# Patient Record
Sex: Male | Born: 1960
Health system: Southern US, Community
[De-identification: ages and names within clinical notes are randomized; demographics above are authoritative.]

## PROBLEM LIST (undated history)

## (undated) DIAGNOSIS — I10 Essential (primary) hypertension: Secondary | ICD-10-CM

## (undated) HISTORY — DX: Essential (primary) hypertension: I10

---

## 2016-09-02 ENCOUNTER — Emergency Department (HOSPITAL_COMMUNITY)
Admission: EM | Admit: 2016-09-02 | Discharge: 2016-09-02 | Disposition: A | Discharge status: Home / Self Care | Payer: BLUE CROSS/BLUE SHIELD | Attending: Emergency Medicine | Admitting: Emergency Medicine

## 2016-09-02 ENCOUNTER — Encounter (HOSPITAL_COMMUNITY): Payer: Self-pay | Admitting: Emergency Medicine

## 2016-09-02 DIAGNOSIS — S40862A Insect bite (nonvenomous) of left upper arm, initial encounter: Secondary | ICD-10-CM | POA: Diagnosis not present

## 2016-09-02 DIAGNOSIS — Y929 Unspecified place or not applicable: Secondary | ICD-10-CM | POA: Diagnosis not present

## 2016-09-02 DIAGNOSIS — R509 Fever, unspecified: Secondary | ICD-10-CM | POA: Diagnosis not present

## 2016-09-02 DIAGNOSIS — J111 Influenza due to unidentified influenza virus with other respiratory manifestations: Secondary | ICD-10-CM | POA: Diagnosis not present

## 2016-09-02 DIAGNOSIS — M791 Myalgia: Secondary | ICD-10-CM | POA: Diagnosis not present

## 2016-09-02 DIAGNOSIS — Y990 Civilian activity done for income or pay: Secondary | ICD-10-CM | POA: Diagnosis not present

## 2016-09-02 DIAGNOSIS — S80861A Insect bite (nonvenomous), right lower leg, initial encounter: Secondary | ICD-10-CM | POA: Insufficient documentation

## 2016-09-02 DIAGNOSIS — S80862A Insect bite (nonvenomous), left lower leg, initial encounter: Secondary | ICD-10-CM | POA: Diagnosis not present

## 2016-09-02 DIAGNOSIS — R21 Rash and other nonspecific skin eruption: Secondary | ICD-10-CM | POA: Insufficient documentation

## 2016-09-02 DIAGNOSIS — R233 Spontaneous ecchymoses: Secondary | ICD-10-CM | POA: Diagnosis not present

## 2016-09-02 DIAGNOSIS — W57XXXA Bitten or stung by nonvenomous insect and other nonvenomous arthropods, initial encounter: Secondary | ICD-10-CM | POA: Insufficient documentation

## 2016-09-02 DIAGNOSIS — R6889 Other general symptoms and signs: Secondary | ICD-10-CM

## 2016-09-02 DIAGNOSIS — Y9389 Activity, other specified: Secondary | ICD-10-CM | POA: Diagnosis not present

## 2016-09-02 DIAGNOSIS — S40861A Insect bite (nonvenomous) of right upper arm, initial encounter: Secondary | ICD-10-CM | POA: Insufficient documentation

## 2016-09-02 DIAGNOSIS — I499 Cardiac arrhythmia, unspecified: Secondary | ICD-10-CM | POA: Diagnosis not present

## 2016-09-02 LAB — CBC WITH DIFFERENTIAL/PLATELET
BASOS PCT: 0 %
Basophils Absolute: 0 10*3/uL (ref 0.0–0.1)
EOS ABS: 0.1 10*3/uL (ref 0.0–0.7)
Eosinophils Relative: 1 %
HEMATOCRIT: 42.3 % (ref 39.0–52.0)
HEMOGLOBIN: 14.3 g/dL (ref 13.0–17.0)
LYMPHS ABS: 1 10*3/uL (ref 0.7–4.0)
Lymphocytes Relative: 12 %
MCH: 31.9 pg (ref 26.0–34.0)
MCHC: 33.8 g/dL (ref 30.0–36.0)
MCV: 94.4 fL (ref 78.0–100.0)
Monocytes Absolute: 0.6 10*3/uL (ref 0.1–1.0)
Monocytes Relative: 8 %
NEUTROS ABS: 6.7 10*3/uL (ref 1.7–7.7)
NEUTROS PCT: 79 %
Platelets: 179 10*3/uL (ref 150–400)
RBC: 4.48 MIL/uL (ref 4.22–5.81)
RDW: 12.2 % (ref 11.5–15.5)
WBC: 8.5 10*3/uL (ref 4.0–10.5)

## 2016-09-02 LAB — COMPREHENSIVE METABOLIC PANEL
ALBUMIN: 3.4 g/dL — AB (ref 3.5–5.0)
ALK PHOS: 60 U/L (ref 38–126)
ALT: 32 U/L (ref 17–63)
AST: 26 U/L (ref 15–41)
Anion gap: 8 (ref 5–15)
BILIRUBIN TOTAL: 0.9 mg/dL (ref 0.3–1.2)
BUN: 20 mg/dL (ref 6–20)
CALCIUM: 8.9 mg/dL (ref 8.9–10.3)
CO2: 25 mmol/L (ref 22–32)
CREATININE: 1.22 mg/dL (ref 0.61–1.24)
Chloride: 107 mmol/L (ref 101–111)
GFR calc Af Amer: >60 mL/min (ref >60)
GFR calc non Af Amer: >60 mL/min (ref >60)
GLUCOSE: 150 mg/dL — AB (ref 65–99)
Potassium: 3.9 mmol/L (ref 3.5–5.1)
SODIUM: 140 mmol/L (ref 135–145)
Total Protein: 7 g/dL (ref 6.5–8.1)

## 2016-09-02 MED ORDER — DOXYCYCLINE HYCLATE 50 MG PO CAPS: 100.0000 mg | ORAL_CAPSULE | Freq: Two times a day (BID) | ORAL | 0 refills | Status: AC

## 2016-09-02 NOTE — ED Provider Notes (Signed)
[facility]-EMER[facility]ENCY DEPT Provider Note   CSN: [id number] Arrival date & time: 09/02/16  1424     History   Chief Complaint Chief Complaint  Patient presents with  . Rash  . Generalized Body Aches    HPI Brett Cervantes is a 56 y.o. male presenting with 10 day history of fever, malaise, and rash.  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 About 10 days ago, he started to notice that he felt very tired will he was doing his normal amount of work. He started to complain of body aches, and was taking ibuprofen to help with this. He felt like he was having intermittent fevers with T max of 101, and was trying to keep this controlled with ibuprofen. Early last week, he noticed a rash on his lower legs. Since then he has noticed that the rash has spread to his upper extremities and torso. The rash is nonpruritic and nonpainful. He denies headaches, neck stiffness, chest pain, shortness of breath, nausea, vomiting, or abdominal pain. Patient states that today he feels slightly more energetic. Patient went to his primary care provider today, and was assessed for his symptoms. At that time, his heart rate was 170. They did an EKG, and an indolence was called. Patient refused ambulance services, and drove here by himself.  HPI  History reviewed. No pertinent past medical history.  There are no active problems to display for this patient.   History reviewed. No pertinent surgical history.     Home Medications    Prior to Admission medications   Medication Sig Start Date End Date Taking? Authorizing Provider  doxycycline (VIBRAMYCIN) 50 MG capsule Take 2 capsules (100 mg total) by mouth 2 (two) times daily. 09/02/16 09/09/16  , , PA-C    Family History No family history on file.  Social History Social History  Substance Use Topics  . Smoking status: Never Smoker  . Smokeless tobacco: Never Used  . Alcohol use 1.2 oz/week      2 Cans of beer per week     Allergies   Patient has no allergy information on record.   Review of Systems Review of Systems  All other systems reviewed and are negative.    Physical Exam Updated Vital Signs BP (!) 144/91 (BP Location: Right Arm)   Pulse 87   Temp 99 F (37.2 C) (Oral)   Resp 20   Ht 5\' 11"  (1.803 m)   Wt 80.7 kg (178 lb)   SpO2 98%   BMI 24.83 kg/m   Physical Exam  Constitutional: He is oriented to person, place, and time. He appears well-developed and well-nourished. No distress.  HENT:  Head: Normocephalic and atraumatic.  Nose: Nose normal.  Mouth/Throat: Uvula is midline and oropharynx is clear and moist.  Eyes: Conjunctivae and EOM are normal. Pupils are equal, round, and reactive to light.  Neck: Normal range of motion. Neck supple. No Brudzinski's sign noted.  Cardiovascular: Normal rate, regular rhythm, normal heart sounds and intact distal pulses.   Pulmonary/Chest: Effort normal and breath sounds normal. No respiratory distress. He has no wheezes.  Abdominal: Soft. Bowel sounds are normal. He exhibits no distension. There is no tenderness.  Musculoskeletal: Normal range of motion.  Neurological: He is alert and oriented to person, place, and time.  Skin: Skin is warm and dry. Rash noted. He is not diaphoretic.  Patient with nonblanching petechial rash of the upper and lower  extremities. Patient with blanching rash of the torso.  Psychiatric: He has a normal mood and affect.     ED Treatments / Results  Labs (all labs ordered are listed, but only abnormal results are displayed) Labs Reviewed  COMPREHENSIVE METABOLIC PANEL - Abnormal; Notable for the following:       Result Value   Glucose, Bld 150 (*)    Albumin 3.4 (*)    All other components within normal limits  CBC WITH DIFFERENTIAL/PLATELET  ROCKY MTN SPOTTED FVR ABS PNL(IGG+IGM)    EKG  EKG Interpretation None       Radiology No results  found.  Procedures Procedures (including critical care time)  Medications Ordered in ED Medications - No data to display   Initial Impression / Assessment and Plan / ED Course  I have reviewed the triage vital signs and the nursing notes.  Pertinent labs & imaging results that were available during my care of the patient were reviewed by me and considered in my medical decision making (see chart for details).     Patient with known exposure to ticks 2 weeks ago developing flulike symptoms and rash. Concern for [facility] spotted fever. Additionally, patient had elevated heart rate at primary care, but EKG here was reassuring. Labs are reassuring, as white count is normal, platelets are normal, and H&H normal. Will order titer for RMSF. Will treat empirically with doxycycline. Discussed case with attending, Dr. Freida Busman agrees to plan. Discussed findings with patient. Discussed use of doxycycline and risks being outside, inclduing increased sensitivity to light. Return precautions given. Patient states he understands and agreed to plan  Final Clinical Impressions(s) / ED Diagnoses   Final diagnoses:  Flu-like symptoms  Rash    New Prescriptions Discharge Medication List as of 09/02/2016  7:46 PM    START taking these medications   Details  doxycycline (VIBRAMYCIN) 50 MG capsule Take 2 capsules (100 mg total) by mouth 2 (two) times daily., Starting Mon 09/02/2016, Until Mon 09/09/2016, Print         , [place], PA-C 09/02/16 2051    Lorre Nick, MD 09/04/16 2332

## 2016-09-02 NOTE — ED Triage Notes (Signed)
Pt st's he pulled 2 ticks off of him 2 weeks ago.  Last week started having generalized body aches with elevated temp

## 2016-09-02 NOTE — Discharge Instructions (Signed)
Take the doxycycline as prescribed. You may continue to use ibuprofen or Tylenol as needed for fever and pain control. Continue to stay well-hydrated. Follow-up with your primary care in 1 week to ensure resolution of symptoms. Return to the emergency department if you have worsening fever, chills, or myalgias. Return to the ER if you develop any new or worsening symptoms.

## 2016-09-06 LAB — ROCKY MTN SPOTTED FVR ABS PNL(IGG+IGM)
RMSF IGG: NEGATIVE
RMSF IGM: 0.16 {index} (ref 0.00–0.89)

## 2017-03-27 DIAGNOSIS — N50812 Left testicular pain: Secondary | ICD-10-CM | POA: Diagnosis not present

## 2017-04-01 ENCOUNTER — Other Ambulatory Visit: Payer: Self-pay | Admitting: Family Medicine

## 2017-04-01 ENCOUNTER — Ambulatory Visit
Admission: RE | Admit: 2017-04-01 | Discharge: 2017-04-01 | Disposition: A | Discharge status: Home / Self Care | Payer: BLUE CROSS/BLUE SHIELD | Source: Ambulatory Visit | Attending: Family Medicine | Admitting: Family Medicine

## 2017-04-01 DIAGNOSIS — N509 Disorder of male genital organs, unspecified: Secondary | ICD-10-CM | POA: Diagnosis not present

## 2017-04-01 DIAGNOSIS — N50812 Left testicular pain: Secondary | ICD-10-CM

## 2017-07-07 DIAGNOSIS — R102 Pelvic and perineal pain: Secondary | ICD-10-CM | POA: Diagnosis not present

## 2017-08-04 DIAGNOSIS — K4090 Unilateral inguinal hernia, without obstruction or gangrene, not specified as recurrent: Secondary | ICD-10-CM | POA: Diagnosis not present

## 2017-09-26 DIAGNOSIS — R102 Pelvic and perineal pain: Secondary | ICD-10-CM | POA: Diagnosis not present

## 2017-09-26 DIAGNOSIS — Z125 Encounter for screening for malignant neoplasm of prostate: Secondary | ICD-10-CM | POA: Diagnosis not present

## 2017-11-13 DIAGNOSIS — L814 Other melanin hyperpigmentation: Secondary | ICD-10-CM | POA: Diagnosis not present

## 2017-11-13 DIAGNOSIS — L578 Other skin changes due to chronic exposure to nonionizing radiation: Secondary | ICD-10-CM | POA: Diagnosis not present

## 2017-11-13 DIAGNOSIS — D229 Melanocytic nevi, unspecified: Secondary | ICD-10-CM | POA: Diagnosis not present

## 2017-11-13 DIAGNOSIS — L821 Other seborrheic keratosis: Secondary | ICD-10-CM | POA: Diagnosis not present

## 2017-11-13 DIAGNOSIS — L570 Actinic keratosis: Secondary | ICD-10-CM | POA: Diagnosis not present

## 2018-01-21 DIAGNOSIS — Z23 Encounter for immunization: Secondary | ICD-10-CM | POA: Diagnosis not present

## 2018-01-21 DIAGNOSIS — M25562 Pain in left knee: Secondary | ICD-10-CM | POA: Diagnosis not present

## 2018-01-23 ENCOUNTER — Other Ambulatory Visit: Payer: Self-pay | Admitting: Family Medicine

## 2018-01-23 DIAGNOSIS — M25562 Pain in left knee: Secondary | ICD-10-CM

## 2018-04-14 DIAGNOSIS — M722 Plantar fascial fibromatosis: Secondary | ICD-10-CM | POA: Diagnosis not present

## 2018-05-03 DIAGNOSIS — J209 Acute bronchitis, unspecified: Secondary | ICD-10-CM | POA: Diagnosis not present

## 2018-12-23 DIAGNOSIS — D1801 Hemangioma of skin and subcutaneous tissue: Secondary | ICD-10-CM | POA: Diagnosis not present

## 2018-12-23 DIAGNOSIS — L820 Inflamed seborrheic keratosis: Secondary | ICD-10-CM | POA: Diagnosis not present

## 2018-12-23 DIAGNOSIS — D225 Melanocytic nevi of trunk: Secondary | ICD-10-CM | POA: Diagnosis not present

## 2018-12-23 DIAGNOSIS — L570 Actinic keratosis: Secondary | ICD-10-CM | POA: Diagnosis not present

## 2018-12-23 DIAGNOSIS — L814 Other melanin hyperpigmentation: Secondary | ICD-10-CM | POA: Diagnosis not present

## 2018-12-23 DIAGNOSIS — L821 Other seborrheic keratosis: Secondary | ICD-10-CM | POA: Diagnosis not present

## 2019-02-02 DIAGNOSIS — R1032 Left lower quadrant pain: Secondary | ICD-10-CM | POA: Diagnosis not present

## 2019-02-02 DIAGNOSIS — I499 Cardiac arrhythmia, unspecified: Secondary | ICD-10-CM | POA: Diagnosis not present

## 2019-02-02 DIAGNOSIS — Z23 Encounter for immunization: Secondary | ICD-10-CM | POA: Diagnosis not present

## 2019-02-02 DIAGNOSIS — Z125 Encounter for screening for malignant neoplasm of prostate: Secondary | ICD-10-CM | POA: Diagnosis not present

## 2019-02-02 DIAGNOSIS — Z0000 Encounter for general adult medical examination without abnormal findings: Secondary | ICD-10-CM | POA: Diagnosis not present

## 2019-02-02 DIAGNOSIS — Z1159 Encounter for screening for other viral diseases: Secondary | ICD-10-CM | POA: Diagnosis not present

## 2019-02-03 DIAGNOSIS — R634 Abnormal weight loss: Secondary | ICD-10-CM | POA: Diagnosis not present

## 2019-02-03 DIAGNOSIS — R1032 Left lower quadrant pain: Secondary | ICD-10-CM | POA: Diagnosis not present

## 2019-02-03 DIAGNOSIS — Z1211 Encounter for screening for malignant neoplasm of colon: Secondary | ICD-10-CM | POA: Diagnosis not present

## 2019-05-11 DIAGNOSIS — Z5181 Encounter for therapeutic drug level monitoring: Secondary | ICD-10-CM | POA: Diagnosis not present

## 2019-05-11 DIAGNOSIS — I499 Cardiac arrhythmia, unspecified: Secondary | ICD-10-CM | POA: Diagnosis not present

## 2019-05-11 DIAGNOSIS — M1990 Unspecified osteoarthritis, unspecified site: Secondary | ICD-10-CM | POA: Diagnosis not present

## 2019-05-11 DIAGNOSIS — Z13220 Encounter for screening for lipoid disorders: Secondary | ICD-10-CM | POA: Diagnosis not present

## 2019-05-11 DIAGNOSIS — I10 Essential (primary) hypertension: Secondary | ICD-10-CM | POA: Diagnosis not present

## 2019-07-19 DIAGNOSIS — R198 Other specified symptoms and signs involving the digestive system and abdomen: Secondary | ICD-10-CM | POA: Diagnosis not present

## 2019-07-19 DIAGNOSIS — R1032 Left lower quadrant pain: Secondary | ICD-10-CM | POA: Diagnosis not present

## 2019-07-19 DIAGNOSIS — Z1211 Encounter for screening for malignant neoplasm of colon: Secondary | ICD-10-CM | POA: Diagnosis not present

## 2019-08-09 DIAGNOSIS — Z03818 Encounter for observation for suspected exposure to other biological agents ruled out: Secondary | ICD-10-CM | POA: Diagnosis not present

## 2019-08-11 DIAGNOSIS — K5289 Other specified noninfective gastroenteritis and colitis: Secondary | ICD-10-CM | POA: Diagnosis not present

## 2019-08-11 DIAGNOSIS — K5730 Diverticulosis of large intestine without perforation or abscess without bleeding: Secondary | ICD-10-CM | POA: Diagnosis not present

## 2019-08-11 DIAGNOSIS — Z1211 Encounter for screening for malignant neoplasm of colon: Secondary | ICD-10-CM | POA: Diagnosis not present

## 2019-08-13 DIAGNOSIS — Z5181 Encounter for therapeutic drug level monitoring: Secondary | ICD-10-CM | POA: Diagnosis not present

## 2019-08-13 DIAGNOSIS — I10 Essential (primary) hypertension: Secondary | ICD-10-CM | POA: Diagnosis not present

## 2019-08-13 DIAGNOSIS — M1990 Unspecified osteoarthritis, unspecified site: Secondary | ICD-10-CM | POA: Diagnosis not present

## 2019-12-13 DIAGNOSIS — I10 Essential (primary) hypertension: Secondary | ICD-10-CM | POA: Diagnosis not present

## 2019-12-13 DIAGNOSIS — Z5181 Encounter for therapeutic drug level monitoring: Secondary | ICD-10-CM | POA: Diagnosis not present

## 2019-12-13 DIAGNOSIS — M1990 Unspecified osteoarthritis, unspecified site: Secondary | ICD-10-CM | POA: Diagnosis not present

## 2019-12-13 DIAGNOSIS — Z23 Encounter for immunization: Secondary | ICD-10-CM | POA: Diagnosis not present

## 2020-01-24 DIAGNOSIS — L570 Actinic keratosis: Secondary | ICD-10-CM | POA: Diagnosis not present

## 2020-01-24 DIAGNOSIS — L738 Other specified follicular disorders: Secondary | ICD-10-CM | POA: Diagnosis not present

## 2020-01-24 DIAGNOSIS — L819 Disorder of pigmentation, unspecified: Secondary | ICD-10-CM | POA: Diagnosis not present

## 2020-01-24 DIAGNOSIS — L298 Other pruritus: Secondary | ICD-10-CM | POA: Diagnosis not present

## 2020-01-24 DIAGNOSIS — D1801 Hemangioma of skin and subcutaneous tissue: Secondary | ICD-10-CM | POA: Diagnosis not present

## 2020-02-08 DIAGNOSIS — I10 Essential (primary) hypertension: Secondary | ICD-10-CM | POA: Diagnosis not present

## 2020-02-08 DIAGNOSIS — Z0000 Encounter for general adult medical examination without abnormal findings: Secondary | ICD-10-CM | POA: Diagnosis not present

## 2020-02-08 DIAGNOSIS — Z8669 Personal history of other diseases of the nervous system and sense organs: Secondary | ICD-10-CM | POA: Diagnosis not present

## 2020-02-08 DIAGNOSIS — M1990 Unspecified osteoarthritis, unspecified site: Secondary | ICD-10-CM | POA: Diagnosis not present

## 2020-02-08 DIAGNOSIS — Z5181 Encounter for therapeutic drug level monitoring: Secondary | ICD-10-CM | POA: Diagnosis not present

## 2020-02-08 DIAGNOSIS — Z125 Encounter for screening for malignant neoplasm of prostate: Secondary | ICD-10-CM | POA: Diagnosis not present

## 2020-02-15 ENCOUNTER — Other Ambulatory Visit (HOSPITAL_COMMUNITY): Payer: Self-pay | Admitting: Family Medicine

## 2020-02-15 DIAGNOSIS — Z8669 Personal history of other diseases of the nervous system and sense organs: Secondary | ICD-10-CM

## 2020-02-15 DIAGNOSIS — H539 Unspecified visual disturbance: Secondary | ICD-10-CM

## 2020-02-16 ENCOUNTER — Other Ambulatory Visit: Payer: Self-pay

## 2020-02-16 ENCOUNTER — Ambulatory Visit (HOSPITAL_COMMUNITY)
Admission: RE | Admit: 2020-02-16 | Discharge: 2020-02-16 | Disposition: A | Discharge status: Home / Self Care | Payer: BC Managed Care – PPO | Source: Ambulatory Visit | Attending: Cardiology | Admitting: Cardiology

## 2020-02-16 DIAGNOSIS — H539 Unspecified visual disturbance: Secondary | ICD-10-CM

## 2020-02-16 DIAGNOSIS — Z8669 Personal history of other diseases of the nervous system and sense organs: Secondary | ICD-10-CM | POA: Diagnosis not present

## 2020-02-23 DIAGNOSIS — R0789 Other chest pain: Secondary | ICD-10-CM | POA: Diagnosis not present

## 2020-04-09 NOTE — Progress Notes (Signed)
Cardiology Office Note:   Date:  04/10/2020  NAME:  Brett Cervantes    MRN: [id number] DOB:  10-Apr-1960   PCP:  [facility] [place]  Cardiologist:  No primary care provider on file.  ` Referring MD: Vernie Shanks, MD   Chief Complaint  Patient presents with  . Chest Pain   History of Present Illness:   Brett Cervantes is a 60 y.o. male with a hx of HTN who is being seen today for the evaluation of chest pain at the request of Vernie Shanks, MD.  He reports intermittent chest discomfort for the last 2 months.  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  Apparently 1 spray painting him get a dull achy sensation left chest.  Symptoms are worsened by movement of his arms.  They are not worsened by heavy exertion such as climbing stairs or going on walks.  He reports the pain last while he was doing work and resolves when he is sitting.  He reports certain arm movements that made it worse.  He continued to have intermittent episodes periodically.  He tried meloxicam and symptoms have improved.  He denies any shortness of breath or exertional chest pain or pressure.  Symptoms appear to occur with vacation.  He reports been bothered by these.  His father has a history of heart disease.  He had bypass surgery in his 38s.  Lipid profile below.  Is never had a heart attack or stroke.  He does have hypertension.  He is a never smoker.  He drinks alcohol daily.  No drug use".  He is married and has stepchildren.  He denies any chest pain today.  His EKG in office demonstrates normal sinus rhythm with no acute ischemic changes or evidence of prior infarction.  Total cholesterol 151, HDL 39, LDL 94, triglycerides 88  Past Medical History: Past Medical History:  Diagnosis Date  . Hypertension     Past Surgical History: History reviewed. No pertinent surgical history.  Current Medications: Current Meds  Medication Sig  . amLODipine (NORVASC)  5 MG tablet Take 5 mg by mouth daily.  . metoprolol tartrate (LOPRESSOR) 100 MG tablet Take 1 tablet by mouth once for procedure.  . naproxen (NAPROSYN) 500 MG tablet Take 1 tablet (500 mg total) by mouth 2 (two) times daily with a meal. For 7 days  . [DISCONTINUED] meloxicam (MOBIC) 15 MG tablet Take 15 mg by mouth as directed.     Allergies:    Patient has no allergy information on record.   Social History: Social History   Socioeconomic History  . Marital status: Married    Spouse name: Not on file  . Number of children: Not on file  . Years of education: Not on file  . Highest education level: Not on file  Occupational History  . Occupation: Architect   Tobacco Use  . Smoking status: Never Smoker  . Smokeless tobacco: Never Used  Substance and Sexual Activity  . Alcohol use: Yes    Alcohol/week: 2.0 standard drinks    Types: 2 Cans of beer per week  . Drug use: No  . Sexual activity: Not on file  Other Topics Concern  . Not on file  Social History Narrative  . Not on file   Social Determinants of Health   Financial Resource Strain: Not on file  Food Insecurity: Not on file  Transportation Needs: Not on  file  Physical Activity: Not on file  Stress: Not on file  Social Connections: Not on file     Family History: The patient's family history includes Heart attack in his father.  ROS:   All other ROS reviewed and negative. Pertinent positives noted in the HPI.     EKGs/Labs/Other Studies Reviewed:   The following studies were personally reviewed by me today:  EKG:  EKG is ordered today.  The ekg ordered today demonstrates normal sinus rhythm heart rate 69, no acute ischemic changes, left axis deviation noted, and was personally reviewed by me.   Recent Labs: No results found for requested labs within last 8760 hours.   Recent Lipid Panel No results found for: CHOL, TRIG, HDL, CHOLHDL, VLDL, LDLCALC, LDLDIRECT  Physical Exam:   VS:  BP 132/78   Pulse  69   Ht _0  (1.778 m)   Wt 186 lb 9.6 oz (84.6 kg)   BMI 26.77 kg/m    Wt Readings from Last 3 Encounters:  04/10/20 186 lb 9.6 oz (84.6 kg)  09/02/16 178 lb (80.7 kg)    General: Well nourished, well developed, in no acute distress Head: Atraumatic, normal size  Eyes: PEERLA, EOMI  Neck: Supple, no JVD Endocrine: No thryomegaly Cardiac: Normal S1, S2; RRR; no murmurs, rubs, or gallops Lungs: Clear to auscultation bilaterally, no wheezing, rhonchi or rales  Abd: Soft, nontender, no hepatomegaly  Ext: No edema, pulses 2+ Musculoskeletal: No deformities, BUE and BLE strength normal and equal Skin: Warm and dry, no rashes   Neuro: Alert and oriented to person, place, time, and situation, CNII-XII grossly intact, no focal deficits  Psych: Normal mood and affect   ASSESSMENT:   Brett Cervantes is a 60 y.o. male who presents for the following: 1. Chest pain, unspecified type     PLAN:   1. Chest pain, unspecified type -Atypical chest pain.  EKG in office is normal.  Cardiovascular disease is normal.  Symptoms are likely musculoskeletal in nature.  CVD risk adequate hypertension and family history of heart disease.  I have recommended coronary CTA for further evaluation.  He will take 100 mg metoprolol tartrate to obtain a scan.  We will get his blood work including a BMP, TSH and CBC today.  Overall symptoms are likely costochondritis related.  I recommended naproxen 500 mg twice daily for 7 days. Plenty of water with this.  I will see him back in 3 months after the above testing.  Disposition: Return in about 3 months (around 07/08/2020).  Medication Adjustments/Labs and Tests Ordered: Current medicines are reviewed at length with the patient today.  Concerns regarding medicines are outlined above.  Orders Placed This Encounter  Procedures  . CT CORONARY MORPH W/CTA COR W/SCORE W/CA W/CM &/OR WO/CM  . CT CORONARY FRACTIONAL FLOW RESERVE DATA PREP  . CT CORONARY FRACTIONAL FLOW  RESERVE FLUID ANALYSIS  . Basic metabolic panel  . CBC  . TSH  . EKG 12-Lead   Meds ordered this encounter  Medications  . metoprolol tartrate (LOPRESSOR) 100 MG tablet    Sig: Take 1 tablet by mouth once for procedure.    Dispense:  1 tablet    Refill:  0  . naproxen (NAPROSYN) 500 MG tablet    Sig: Take 1 tablet (500 mg total) by mouth 2 (two) times daily with a meal. For 7 days    Dispense:  14 tablet    Refill:  0    Patient Instructions  Medication Instructions:  Start Naproxen 500 mg twice daily for 7 days. Stop Meloxicam  Take Metoprolol two hours before CT when scheduled.   *If you need a refill on your cardiac medications before your next appointment, please call your pharmacy*   Lab Work: CBC, BMET, TSH today  If you have labs (blood work) drawn today and your tests are completely normal, you will receive your results only by: Marland Kitchen MyChart Message (if you have MyChart) OR . A paper copy in the mail If you have any lab test that is abnormal or we need to change your treatment, we will call you to review the results.   Testing/Procedures:  Your physician has requested that you have cardiac CT. Cardiac computed tomography (CT) is a painless test that uses an x-ray machine to take clear, detailed pictures of your heart. For further information please visit [email or web address].[email or web address]. Please follow instruction sheet as given.    Follow-Up: At [facility], you and your health needs are our priority.  As part of our continuing mission to provide you with exceptional heart care, we have created designated Provider Care Teams.  These Care Teams include your primary Cardiologist (physician) and Advanced Practice Providers (APPs -  Physician Assistants and Nurse Practitioners) who all work together to provide you with the care you need, when you need it.  We recommend signing up for the patient portal called "MyChart".  Sign up information is provided on this After Visit Summary.   MyChart is used to connect with patients for Virtual Visits (Telemedicine).  Patients are able to view lab/test results, encounter notes, upcoming appointments, etc.  Non-urgent messages can be sent to your provider as well.   To learn more about what you can do with MyChart, go to [email or web address].    Your next appointment:   3 month(s)  The format for your next appointment:   In Person  Provider:   Eleonore Chiquito, MD   Other Instructions Your cardiac CT will be scheduled at one of the below locations:   [facility] [address] [place], [place] [address] [phone]  [place] [address] [place], [place] [address] [phone]  If scheduled at [facility], please arrive at the [facility] main entrance of [facility] 30 minutes prior to test start time. Proceed to the [facility] Radiology Department (first floor) to check-in and test prep.  If scheduled at [facility], please arrive 15 mins early for check-in and test prep.  Please follow these instructions carefully (unless otherwise directed):  Hold all erectile dysfunction medications at least 3 days (72 hrs) prior to test.  On the Night Before the Test: . Be sure to Drink plenty of water. . Do not consume any caffeinated/decaffeinated beverages or chocolate 12 hours prior to your test. . Do not take any antihistamines 12 hours prior to your test.  On the Day of the Test: . Drink plenty of water. Do not drink any water within one hour of the test. . Do not eat any food 4 hours prior to the test. . You may take your regular medications prior to the test.  . Take metoprolol (Lopressor) two hours prior to test. . HOLD Furosemide/Hydrochlorothiazide morning of the test. . FEMALES- please wear underwire-free bra if available   After the Test: . Drink plenty of water. . After receiving IV  contrast, you may experience a mild flushed feeling. This is  normal. . On occasion, you may experience a mild rash up to 24 hours after the test. This is not dangerous. If this occurs, you can take Benadryl 25 mg and increase your fluid intake. . If you experience trouble breathing, this can be serious. If it is severe call 911 IMMEDIATELY. If it is mild, please call our office. . If you take any of these medications: Glipizide/Metformin, Avandament, Glucavance, please do not take 48 hours after completing test unless otherwise instructed.   Once we have confirmed authorization from your insurance company, we will call you to set up a date and time for your test. Based on how quickly your insurance processes prior authorizations requests, please allow up to 4 weeks to be contacted for scheduling your Cardiac CT appointment. Be advised that routine Cardiac CT appointments could be scheduled as many as 8 weeks after your provider has ordered it.  For non-scheduling related questions, please contact the cardiac imaging nurse navigator should you have any questions/concerns: Marchia Bond, Cardiac Imaging Nurse Navigator [place]ley Saver, Interim Cardiac Imaging Nurse [place] and Vascular Services Direct Office Dial: [phone]   For scheduling needs, including cancellations and rescheduling, please call [place], [phone].     Signed, Addison Naegeli. Audie Box, [place]  [address] [place], [place] [address] [phone]  04/10/2020 8:47 AM

## 2020-04-10 ENCOUNTER — Encounter: Payer: Self-pay | Admitting: Cardiovascular Disease

## 2020-04-10 ENCOUNTER — Ambulatory Visit: Payer: BC Managed Care – PPO | Admitting: Cardiovascular Disease

## 2020-04-10 ENCOUNTER — Other Ambulatory Visit: Payer: Self-pay

## 2020-04-10 VITALS — BP 132/78 | HR 69 | Ht 70.0 in | Wt 186.6 lb

## 2020-04-10 DIAGNOSIS — R079 Chest pain, unspecified: Secondary | ICD-10-CM | POA: Diagnosis not present

## 2020-04-10 LAB — BASIC METABOLIC PANEL
CO2: 24 mmol/L (ref 20–29)
Chloride: 103 mmol/L (ref 96–106)
GFR calc non Af Amer: 72 mL/min/{1.73_m2} (ref >59)
Potassium: 5.4 mmol/L — ABNORMAL HIGH (ref 3.5–5.2)
Sodium: 140 mmol/L (ref 134–144)

## 2020-04-10 LAB — SPECIMEN STATUS REPORT

## 2020-04-10 LAB — CBC

## 2020-04-10 MED ORDER — METOPROLOL TARTRATE 100 MG PO TABS: ORAL_TABLET | ORAL | 0 refills | Status: DC

## 2020-04-10 MED ORDER — NAPROXEN 500 MG PO TABS: 500.0000 mg | ORAL_TABLET | Freq: Two times a day (BID) | ORAL | 0 refills | Status: DC

## 2020-04-10 NOTE — Patient Instructions (Addendum)
Medication Instructions:  Start Naproxen 500 mg twice daily for 7 days. Stop Meloxicam  Take Metoprolol two hours before CT when scheduled.   *If you need a refill on your cardiac medications before your next appointment, please call your pharmacy*   Lab Work: CBC, BMET, TSH today  If you have labs (blood work) drawn today and your tests are completely normal, you will receive your results only by: Marland Kitchen MyChart Message (if you have MyChart) OR . A paper copy in the mail If you have any lab test that is abnormal or we need to change your treatment, we will call you to review the results.   Testing/Procedures:  Your physician has requested that you have cardiac CT. Cardiac computed tomography (CT) is a painless test that uses an x-ray machine to take clear, detailed pictures of your heart. For further information please visit [email or web address]. Please follow instruction sheet as given.    Follow-Up: At [facility], you and your health needs are our priority.  As part of our continuing mission to provide you with exceptional heart care, we have created designated Provider Care Teams.  These Care Teams include your primary Cardiologist (physician) and Advanced Practice Providers (APPs -  Physician Assistants and Nurse Practitioners) who all work together to provide you with the care you need, when you need it.  We recommend signing up for the patient portal called "MyChart".  Sign up information is provided on this After Visit Summary.  MyChart is used to connect with patients for Virtual Visits (Telemedicine).  Patients are able to view lab/test results, encounter notes, upcoming appointments, etc.  Non-urgent messages can be sent to your provider as well.   To learn more about what you can do with MyChart, go to [email or web address].    Your next appointment:   3 month(s)  The format for your next appointment:   In Person  Provider:   Eleonore Chiquito, MD   Other  Instructions Your cardiac CT will be scheduled at one of the below locations:   [facility] [address] [place], [place] [address] [phone]  [place] [address] [place], [place] [address] [phone]  If scheduled at [facility], please arrive at the [facility] main entrance of [facility] 30 minutes prior to test start time. Proceed to the [facility] Radiology Department (first floor) to check-in and test prep.  If scheduled at [facility], please arrive 15 mins early for check-in and test prep.  Please follow these instructions carefully (unless otherwise directed):  Hold all erectile dysfunction medications at least 3 days (72 hrs) prior to test.  On the Night Before the Test: . Be sure to Drink plenty of water. . Do not consume any caffeinated/decaffeinated beverages or chocolate 12 hours prior to your test. . Do not take any antihistamines 12 hours prior to your test.  On the Day of the Test: . Drink plenty of water. Do not drink any water within one hour of the test. . Do not eat any food 4 hours prior to the test. . You may take your regular medications prior to the test.  . Take metoprolol (Lopressor) two hours prior to test. . HOLD Furosemide/Hydrochlorothiazide morning of the test. . FEMALES- please wear underwire-free bra if available   After the Test: . Drink plenty of water. . After receiving IV contrast, you may experience a mild flushed feeling. This is normal. .  On occasion, you may experience a mild rash up to 24 hours after the test. This is not dangerous. If this occurs, you can take Benadryl 25 mg and increase your fluid intake. . If you experience trouble breathing, this can be serious. If it is severe call 911 IMMEDIATELY. If it is mild, please call our office. . If you take any of these medications: Glipizide/Metformin, Avandament,  Glucavance, please do not take 48 hours after completing test unless otherwise instructed.   Once we have confirmed authorization from your insurance company, we will call you to set up a date and time for your test. Based on how quickly your insurance processes prior authorizations requests, please allow up to 4 weeks to be contacted for scheduling your Cardiac CT appointment. Be advised that routine Cardiac CT appointments could be scheduled as many as 8 weeks after your provider has ordered it.  For non-scheduling related questions, please contact the cardiac imaging nurse navigator should you have any questions/concerns: Marchia Bond, Cardiac Imaging Nurse Navigator Burley Saver, Interim Cardiac Imaging Nurse [place] and Vascular Services Direct Office Dial: [phone]   For scheduling needs, including cancellations and rescheduling, please call [place], [phone].

## 2020-04-11 LAB — CBC
Hemoglobin: 14.9 g/dL (ref 13.0–17.7)
MCH: 31.2 pg (ref 26.6–33.0)
MCV: 93 fL (ref 79–97)
Platelets: 184 10*3/uL (ref 150–450)
RBC: 4.77 x10E6/uL (ref 4.14–5.80)
RDW: 11.5 % — ABNORMAL LOW (ref 11.6–15.4)
WBC: 4.9 10*3/uL (ref 3.4–10.8)

## 2020-04-11 LAB — TSH: TSH: 0.997 u[IU]/mL (ref 0.450–4.500)

## 2020-04-11 LAB — BASIC METABOLIC PANEL
BUN/Creatinine Ratio: 15 (ref 9–20)
BUN: 17 mg/dL (ref 6–24)
Calcium: 9.5 mg/dL (ref 8.7–10.2)
Creatinine, Ser: 1.12 mg/dL (ref 0.76–1.27)
GFR calc Af Amer: 83 mL/min/{1.73_m2} (ref >59)
Glucose: 97 mg/dL (ref 65–99)

## 2020-04-13 ENCOUNTER — Other Ambulatory Visit: Payer: Self-pay | Admitting: Cardiovascular Disease

## 2020-04-17 ENCOUNTER — Telehealth: Payer: Self-pay | Admitting: Cardiovascular Disease

## 2020-04-17 NOTE — Telephone Encounter (Signed)
Patient wanted to have a Nurse call him to discuss his lab results. He got a text from MyChart but does not use the service

## 2020-04-17 NOTE — Telephone Encounter (Signed)
Called patient, gave results.  ?Patient verbalized understanding.  ? ? ?

## 2020-04-19 ENCOUNTER — Telehealth (HOSPITAL_COMMUNITY): Payer: Self-pay | Admitting: Emergency Medicine

## 2020-04-19 NOTE — Telephone Encounter (Signed)
Reaching out to patient to offer assistance regarding upcoming cardiac imaging study; pt verbalizes understanding of appt date/time, parking situation and where to check in, pre-test NPO status and medications ordered, and verified current allergies; name and call back number provided for further questions should they arise   RN Navigator Cardiac Imaging [facility] [phone] office [phone] cell   100mg metoprolol tartrate 2h PTA   

## 2020-04-21 ENCOUNTER — Inpatient Hospital Stay (HOSPITAL_COMMUNITY)
Admission: RE | Admit: 2020-04-21 | Discharge: 2020-04-21 | Disposition: A | Discharge status: Home / Self Care | Payer: BC Managed Care – PPO | Source: Ambulatory Visit | Attending: Cardiovascular Disease | Admitting: Cardiovascular Disease

## 2020-04-21 ENCOUNTER — Encounter: Payer: Self-pay | Admitting: *Deleted

## 2020-04-21 ENCOUNTER — Other Ambulatory Visit: Payer: Self-pay

## 2020-04-21 ENCOUNTER — Encounter: Payer: BC Managed Care – PPO | Admitting: *Deleted

## 2020-04-21 ENCOUNTER — Inpatient Hospital Stay (HOSPITAL_COMMUNITY)
Admission: EM | Admit: 2020-04-21 | Discharge: 2020-04-25 | DRG: 221 | Disposition: A | Discharge status: Home / Self Care | Payer: BC Managed Care – PPO | Attending: Vascular Surgery | Admitting: Vascular Surgery

## 2020-04-21 ENCOUNTER — Encounter (HOSPITAL_COMMUNITY): Payer: Self-pay | Admitting: Emergency Medicine

## 2020-04-21 ENCOUNTER — Other Ambulatory Visit: Payer: Self-pay | Admitting: Cardiovascular Disease

## 2020-04-21 DIAGNOSIS — D696 Thrombocytopenia, unspecified: Secondary | ICD-10-CM | POA: Diagnosis not present

## 2020-04-21 DIAGNOSIS — I2510 Atherosclerotic heart disease of native coronary artery without angina pectoris: Secondary | ICD-10-CM

## 2020-04-21 DIAGNOSIS — I739 Peripheral vascular disease, unspecified: Secondary | ICD-10-CM | POA: Diagnosis not present

## 2020-04-21 DIAGNOSIS — Z01810 Encounter for preprocedural cardiovascular examination: Secondary | ICD-10-CM

## 2020-04-21 DIAGNOSIS — N400 Enlarged prostate without lower urinary tract symptoms: Secondary | ICD-10-CM | POA: Diagnosis not present

## 2020-04-21 DIAGNOSIS — K769 Liver disease, unspecified: Secondary | ICD-10-CM | POA: Diagnosis not present

## 2020-04-21 DIAGNOSIS — Z20822 Contact with and (suspected) exposure to covid-19: Secondary | ICD-10-CM | POA: Diagnosis present

## 2020-04-21 DIAGNOSIS — I712 Thoracic aortic aneurysm, without rupture: Secondary | ICD-10-CM | POA: Diagnosis not present

## 2020-04-21 DIAGNOSIS — Z8249 Family history of ischemic heart disease and other diseases of the circulatory system: Secondary | ICD-10-CM

## 2020-04-21 DIAGNOSIS — I7101 Dissection of thoracic aorta: Secondary | ICD-10-CM | POA: Diagnosis present

## 2020-04-21 DIAGNOSIS — R079 Chest pain, unspecified: Secondary | ICD-10-CM

## 2020-04-21 DIAGNOSIS — N281 Cyst of kidney, acquired: Secondary | ICD-10-CM | POA: Diagnosis not present

## 2020-04-21 DIAGNOSIS — I10 Essential (primary) hypertension: Secondary | ICD-10-CM

## 2020-04-21 DIAGNOSIS — Z006 Encounter for examination for normal comparison and control in clinical research program: Secondary | ICD-10-CM

## 2020-04-21 DIAGNOSIS — I71019 Dissection of thoracic aorta, unspecified: Secondary | ICD-10-CM | POA: Diagnosis present

## 2020-04-21 DIAGNOSIS — I7123 Aneurysm of the descending thoracic aorta, without rupture: Secondary | ICD-10-CM | POA: Diagnosis present

## 2020-04-21 LAB — URINALYSIS, ROUTINE W REFLEX MICROSCOPIC
Bilirubin Urine: NEGATIVE
Glucose, UA: NEGATIVE mg/dL
Hgb urine dipstick: NEGATIVE
Ketones, ur: 5 mg/dL — AB
Leukocytes,Ua: NEGATIVE
Nitrite: NEGATIVE
Protein, ur: NEGATIVE mg/dL
Specific Gravity, Urine: 1.039 — ABNORMAL HIGH (ref 1.005–1.030)
pH: 6 (ref 5.0–8.0)

## 2020-04-21 LAB — BASIC METABOLIC PANEL
Anion gap: 8 (ref 5–15)
BUN: 21 mg/dL — ABNORMAL HIGH (ref 6–20)
CO2: 24 mmol/L (ref 22–32)
Calcium: 8.6 mg/dL — ABNORMAL LOW (ref 8.9–10.3)
Chloride: 103 mmol/L (ref 98–111)
Creatinine, Ser: 1.13 mg/dL (ref 0.61–1.24)
GFR, Estimated: >60 mL/min (ref >60)
Glucose, Bld: 97 mg/dL (ref 70–99)
Potassium: 4 mmol/L (ref 3.5–5.1)
Sodium: 135 mmol/L (ref 135–145)

## 2020-04-21 LAB — CBC
HCT: 41.2 % (ref 39.0–52.0)
HCT: 45.7 % (ref 39.0–52.0)
Hemoglobin: 13.9 g/dL (ref 13.0–17.0)
Hemoglobin: 15 g/dL (ref 13.0–17.0)
MCH: 31.8 pg (ref 26.0–34.0)
MCH: 30.9 pg (ref 26.0–34.0)
MCHC: 33.7 g/dL (ref 30.0–36.0)
MCHC: 32.8 g/dL (ref 30.0–36.0)
MCV: 94.3 fL (ref 80.0–100.0)
MCV: 94.2 fL (ref 80.0–100.0)
Platelets: 164 10*3/uL (ref 150–400)
Platelets: 161 10*3/uL (ref 150–400)
RBC: 4.37 MIL/uL (ref 4.22–5.81)
RBC: 4.85 MIL/uL (ref 4.22–5.81)
RDW: 11.7 % (ref 11.5–15.5)
RDW: 11.5 % (ref 11.5–15.5)
WBC: 9.9 10*3/uL (ref 4.0–10.5)
WBC: 8.8 10*3/uL (ref 4.0–10.5)
nRBC: 0 % (ref 0.0–0.2)
nRBC: 0 % (ref 0.0–0.2)

## 2020-04-21 LAB — HIV ANTIBODY (ROUTINE TESTING W REFLEX): HIV Screen 4th Generation wRfx: NONREACTIVE

## 2020-04-21 LAB — PROTIME-INR
INR: 1.1 (ref 0.8–1.2)
INR: 1 (ref 0.8–1.2)
Prothrombin Time: 13.3 seconds (ref 11.4–15.2)
Prothrombin Time: 12.8 seconds (ref 11.4–15.2)

## 2020-04-21 LAB — SARS CORONAVIRUS 2 (TAT 6-24 HRS): SARS Coronavirus 2: NEGATIVE

## 2020-04-21 MED ORDER — ONDANSETRON HCL 4 MG/2ML IJ SOLN: 4.0000 mg | Freq: Four times a day (QID) | INTRAMUSCULAR | Status: DC | PRN

## 2020-04-21 MED ORDER — ACETAMINOPHEN 325 MG RE SUPP
325.0000 mg | RECTAL | Status: DC | PRN
  Filled 2020-04-21: qty 2

## 2020-04-21 MED ORDER — POTASSIUM CHLORIDE CRYS ER 20 MEQ PO TBCR: 20.0000 meq | EXTENDED_RELEASE_TABLET | Freq: Once | ORAL | Status: DC

## 2020-04-21 MED ORDER — HYDRALAZINE HCL 20 MG/ML IJ SOLN: 5.0000 mg | INTRAMUSCULAR | Status: DC | PRN

## 2020-04-21 MED ORDER — GUAIFENESIN-DM 100-10 MG/5ML PO SYRP: 15.0000 mL | ORAL_SOLUTION | ORAL | Status: DC | PRN

## 2020-04-21 MED ORDER — ALUM & MAG HYDROXIDE-SIMETH 200-200-20 MG/5ML PO SUSP: 15.0000 mL | ORAL | Status: DC | PRN

## 2020-04-21 MED ORDER — PHENOL 1.4 % MT LIQD
1.0000 | OROMUCOSAL | Status: DC | PRN
  Filled 2020-04-21: qty 177

## 2020-04-21 MED ORDER — OXYCODONE-ACETAMINOPHEN 5-325 MG PO TABS: 1.0000 | ORAL_TABLET | ORAL | Status: DC | PRN

## 2020-04-21 MED ORDER — HEPARIN SODIUM (PORCINE) 5000 UNIT/ML IJ SOLN
5000.0000 [IU] | Freq: Three times a day (TID) | INTRAMUSCULAR | Status: DC
  Administered 2020-04-21 – 2020-04-23 (×7): 5000 [IU] via SUBCUTANEOUS
  Filled 2020-04-21 (×7): qty 1

## 2020-04-21 MED ORDER — METOPROLOL TARTRATE 5 MG/5ML IV SOLN: 2.0000 mg | INTRAVENOUS | Status: DC | PRN

## 2020-04-21 MED ORDER — ACETAMINOPHEN 325 MG PO TABS
325.0000 mg | ORAL_TABLET | ORAL | Status: DC | PRN
  Administered 2020-04-25: 650 mg via ORAL
  Administered 2020-04-25: 325 mg via ORAL
  Filled 2020-04-21: qty 2
  Filled 2020-04-21: qty 1

## 2020-04-21 MED ORDER — NITROGLYCERIN 0.4 MG SL SUBL
SUBLINGUAL_TABLET | SUBLINGUAL | Status: AC
  Filled 2020-04-21: qty 2

## 2020-04-21 MED ORDER — MORPHINE SULFATE (PF) 2 MG/ML IV SOLN: 2.0000 mg | INTRAVENOUS | Status: DC | PRN

## 2020-04-21 MED ORDER — SODIUM CHLORIDE 0.9% FLUSH: 3.0000 mL | INTRAVENOUS | Status: DC | PRN

## 2020-04-21 MED ORDER — SENNOSIDES-DOCUSATE SODIUM 8.6-50 MG PO TABS: 1.0000 | ORAL_TABLET | Freq: Every evening | ORAL | Status: DC | PRN

## 2020-04-21 MED ORDER — NITROGLYCERIN 0.4 MG SL SUBL
0.8000 mg | SUBLINGUAL_TABLET | Freq: Once | SUBLINGUAL | Status: AC
Start: 2020-04-21 — End: 2020-04-21
  Administered 2020-04-21: 0.8 mg via SUBLINGUAL

## 2020-04-21 MED ORDER — SODIUM CHLORIDE 0.9% FLUSH
3.0000 mL | Freq: Two times a day (BID) | INTRAVENOUS | Status: DC
  Administered 2020-04-21 – 2020-04-25 (×7): 3 mL via INTRAVENOUS

## 2020-04-21 MED ORDER — IOHEXOL 350 MG/ML SOLN
95.0000 mL | Freq: Once | INTRAVENOUS | Status: AC | PRN
  Administered 2020-04-21: 95 mL via INTRAVENOUS

## 2020-04-21 MED ORDER — LABETALOL HCL 5 MG/ML IV SOLN: 10.0000 mg | INTRAVENOUS | Status: DC | PRN

## 2020-04-21 MED ORDER — PANTOPRAZOLE SODIUM 40 MG PO TBEC
40.0000 mg | DELAYED_RELEASE_TABLET | Freq: Every day | ORAL | Status: DC
  Administered 2020-04-21 – 2020-04-25 (×4): 40 mg via ORAL
  Filled 2020-04-21 (×4): qty 1

## 2020-04-21 MED ORDER — SODIUM CHLORIDE 0.9 % IV SOLN: 250.0000 mL | INTRAVENOUS | Status: DC | PRN

## 2020-04-21 NOTE — Consult Note (Addendum)
CONSULTATION NOTE   Patient Name: Brett Cervantes Date of Encounter: 04/21/2020 Cardiologist: No primary care provider on file.  Chief Complaint   Preoperative risk assessment, chest pain  Patient Profile   60 yo male with recent evaluation for chest pain, thought to be atypical, sent for coronary CTA and found to have a large 7.8 cm descending thoracic aortic aneurysm. Cardiology is asked to evaluate for preoperative risk.  HPI   Brett Cervantes is a 60 y.o. male who is being seen today for the evaluation of preoperative risk at the request of Dr. [name]. This is a pleasant 60 year old male with a history of hypertension who had recently been seen by Dr. Flora Lipps on February 7 with complaints of chest pain.  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  He is noted some pain across his chest particularly when doing this motion which is worse with movement.  He says the pain typically take some hours to come on during the day.  He does get some relief from anti-inflammatory medications.  There is a family history of heart disease in his father who had an MI and bypass in his 4s.  A CT scan was ordered to evaluate his coronaries.  Apparently today the scan demonstrated a descending aortic aneurysm up to 7.8 cm with intramural hematoma.  The scan was then changed to a gated CTA of the entire aorta.  He was then referred to the emergency department for evaluation.  Here he was evaluated by Dr. [name] with vascular surgery.  We were consulted for cardiovascular risk assessment.  Currently he reports he is chest pain-free at rest.  PMHx   Past Medical History:  Diagnosis Date  . Hypertension     History reviewed. No pertinent surgical history.  FAMHx   Family History  Problem Relation Age of Onset  . Heart attack Father     SOCHx    reports that he has never smoked. He has never used smokeless tobacco. He reports current alcohol use of about 2.0 standard drinks of  alcohol per week. He reports that he does not use drugs.  Outpatient Medications   Current Facility-Administered Medications on File Prior to Encounter  Medication Dose Route Frequency Provider Last Rate Last Admin  . nitroGLYCERIN (NITROSTAT) 0.4 MG SL tablet            Current Outpatient Medications on File Prior to Encounter  Medication Sig Dispense Refill  . amLODipine (NORVASC) 5 MG tablet Take 5 mg by mouth daily.    . metoprolol tartrate (LOPRESSOR) 100 MG tablet Take 1 tablet by mouth once for procedure. (Patient not taking: Reported on 04/21/2020) 1 tablet 0  . naproxen (NAPROSYN) 500 MG tablet Take 1 tablet (500 mg total) by mouth 2 (two) times daily with a meal. For 7 days (Patient not taking: Reported on 04/21/2020) 14 tablet 0    Inpatient Medications    Scheduled Meds:   Continuous Infusions:   PRN Meds:    ALLERGIES   Allergies  Allergen Reactions  . Ibuprofen Other (See Comments)    tachycardia    ROS   Pertinent items noted in HPI and remainder of comprehensive ROS otherwise negative.  Vitals   Vitals:   04/21/20 1515 04/21/20 1530 04/21/20 1545 04/21/20 1600  BP: 135/85 130/76 130/87 (!) 147/92  Pulse: 70 67 66 68  Resp: 14 14 13 17   Temp:      TempSrc:  SpO2: 99% 98% 99% 100%   No intake or output data in the 24 hours ending 04/21/20 1613 There were no vitals filed for this visit.  Physical Exam   General appearance: alert and no distress Neck: no carotid bruit, no JVD and thyroid not enlarged, symmetric, no tenderness/mass/nodules Lungs: clear to auscultation bilaterally Heart: regular rate and rhythm, S1, S2 normal, no murmur, click, rub or gallop Abdomen: soft, non-tender; bowel sounds normal; no masses,  no organomegaly Extremities: extremities normal, atraumatic, no cyanosis or edema Pulses: 2+ and symmetric Skin: Skin color, texture, turgor normal. No rashes or lesions Neurologic: Grossly normal Psych: Pleasant  Labs    Results for orders placed or performed during the hospital encounter of 04/21/20 (from the past 48 hour(s))  CBC     Status: None   Collection Time: 04/21/20  2:05 PM  Result Value Ref Range   WBC 9.9 4.0 - 10.5 K/uL   RBC 4.37 4.22 - 5.81 MIL/uL   Hemoglobin 13.9 13.0 - 17.0 g/dL   HCT 60.7 37.1 - 06.2 %   MCV 94.3 80.0 - 100.0 fL   MCH 31.8 26.0 - 34.0 pg   MCHC 33.7 30.0 - 36.0 g/dL   RDW 69.4 85.4 - 62.7 %   Platelets 164 150 - 400 K/uL   nRBC 0.0 0.0 - 0.2 %    Comment: Performed at [facility], [address] [place], [place] [address]  Basic metabolic panel     Status: Abnormal   Collection Time: 04/21/20  2:05 PM  Result Value Ref Range   Sodium 135 135 - 145 mmol/L   Potassium 4.0 3.5 - 5.1 mmol/L   Chloride 103 98 - 111 mmol/L   CO2 24 22 - 32 mmol/L   Glucose, Bld 97 70 - 99 mg/dL    Comment: Glucose reference range applies only to samples taken after fasting for at least 8 hours.   BUN 21 (H) 6 - 20 mg/dL   Creatinine, Ser 9.38 0.61 - 1.24 mg/dL   Calcium 8.6 (L) 8.9 - 10.3 mg/dL   GFR, Estimated >18 >29 mL/min    Comment: (NOTE) Calculated using the CKD-EPI Creatinine Equation (2021)    Anion gap 8 5 - 15    Comment: Performed at [facility], [address] [place], [place] [address]  Protime-INR     Status: None   Collection Time: 04/21/20  2:05 PM  Result Value Ref Range   Prothrombin Time 13.3 11.4 - 15.2 seconds   INR 1.1 0.8 - 1.2    Comment: (NOTE) INR goal varies based on device and disease states. Performed at [facility], [address], [place], [place] [address]     ECG   Sinus rhythm, LAFB at 61- Personally Reviewed  Telemetry   Sinus rhythm- Personally Reviewed  Radiology   CT Angio Chest/Abd/Pel for Dissection W and/or W/WO  Result Date: 04/21/2020 CLINICAL DATA:  Anginal chest pain EXAM: CT ANGIOGRAPHY CHEST, ABDOMEN AND PELVIS TECHNIQUE: Non-contrast CT of the chest was initially obtained. Multidetector  CT imaging through the chest, abdomen and pelvis was performed using the standard protocol during bolus administration of intravenous contrast. Multiplanar reconstructed images and MIPs were obtained and reviewed to evaluate the vascular anatomy. CONTRAST:  70mL OMNIPAQUE IOHEXOL 350 MG/ML SOLN COMPARISON:  None. FINDINGS: CTA CHEST FINDINGS Cardiovascular: Heart size within normal limits. Left anterior descending coronary artery calcifications are seen. Mediastinum/Nodes: No enlarged mediastinal, hilar, or axillary lymph  nodes. Thyroid gland, trachea, and esophagus demonstrate no significant findings. Lungs/Pleura: Lungs are clear. Musculoskeletal: No acute abnormality. There are is anterior wedging of multiple midthoracic vertebral bodies with exaggeration of thoracic kyphosis suspicious for Scheuermann's disease. Review of the MIP images confirms the above findings. CTA ABDOMEN AND PELVIS FINDINGS VASCULAR Aorta: The aortic valve is tricuspid. No significant dilatation of the aortic root or ascending thoracic aorta. Fusiform aneurysmal dilatation of the descending thoracic aorta with maximum diameter measuring 7.8 x 7.6 cm. Celiac: Patent without evidence of aneurysm, dissection, vasculitis or significant stenosis. SMA: Patent without evidence of aneurysm, dissection, vasculitis or significant stenosis. Renals: Both renal arteries are patent without evidence of aneurysm, dissection, vasculitis, fibromuscular dysplasia or significant stenosis. IMA: Patent without evidence of aneurysm, dissection, vasculitis or significant stenosis. Inflow: Patent without evidence of aneurysm, dissection, vasculitis or significant stenosis. Veins: No obvious venous abnormality within the limitations of this arterial phase study. Review of the MIP images confirms the above findings. NON-VASCULAR Hepatobiliary: 8 mm hypodense lesion in the medial right hepatic lobe is too small to fully characterize given subcentimeter size. Liver  and gallbladder otherwise unremarkable. No biliary ductal dilatation. Pancreas: Unremarkable. No pancreatic ductal dilatation or surrounding inflammatory changes. Spleen: Normal in size without focal abnormality. Adrenals/Urinary Tract: Adrenal glands are normal. 10 mm simple cyst present in the upper pole of the right kidney. Kidneys are otherwise normal. Bladder is normal. Stomach/Bowel: Descending and sigmoid colon diverticulosis without evidence of acute diverticulitis. Appendix is normal. No dilated loops of bowel to indicate ileus or obstruction. Lymphatic: No enlarged abdominal or pelvic lymph nodes. Reproductive: Prostate is moderately enlarged. Other: No abdominal wall hernia or abnormality. No abdominopelvic ascites. Musculoskeletal: Mild degenerative changes of the hips bilaterally. Review of the MIP images confirms the above findings. IMPRESSION: Aneurysmal dilatation of the descending thoracic aorta measuring up to 7.8 x 7.6 cm. These results were called by telephone at the time of interpretation on 04/21/2020 at 2:15 pm to provider Dr. Margarita Grizzleanielle Ray , who verbally acknowledged these results. Electronically Signed   By: Acquanetta BellingFarhaan  Mir M.D.   On: 04/21/2020 14:43    Cardiac Studies   Reviewed  Impression   Principal Problem:   Aortic dissection distal to left subclavian [facility](HCC) Active Problems:   Coronary artery calcification seen on CT scan   Essential hypertension   Recommendation   1. Mr. Durene CalHunter is describing atypical chest pain as was previously noted by Dr. Flora Lipps. Neal as well.  He had a CT scan today - I personally reviewed his study as well as the coronaries - there is a mild, mixed non-obstructive 25-49% proximal LAD stenosis, but otherwise no significant obstructive CAD. I would say that his risk is acceptable for surgery and it would be ok to proceed without further testing. I have communicated this to Dr. Lenell AntuHawken and Dr. Flora Lipps'Neal.  Thanks for the consultation.   Time Spent Directly  with Patient:  I have spent a total of 45 minutes with the patient reviewing hospital notes, telemetry, EKGs, labs and examining the patient as well as establishing an assessment and plan that was discussed personally with the patient.  > 50% of time was spent in direct patient care.  Length of Stay:  LOS: 0 days   Chrystie NoseKenneth C. , MD, [facility] Medical CenterFACC, FACP  [place]  [facility]  Medical Director of the Advanced Lipid Disorders &  Cardiovascular Risk Reduction Clinic Diplomate of the American Board of Clinical Lipidology Attending Cardiologist  Direct Dial: [phone]  Fax: [phone]  Website:  [email or web address] 04/21/2020, 4:13 PM

## 2020-04-21 NOTE — Research (Signed)
Research visit.

## 2020-04-21 NOTE — ED Notes (Signed)
CT/vasc surgery Dinner Order Placed

## 2020-04-21 NOTE — Research (Signed)
IDENTIFY Informed Consent                  Subject Name:   Brett Cervantes   Subject met inclusion and exclusion criteria.  The informed consent form, study requirements and expectations were reviewed with the subject and questions and concerns were addressed prior to the signing of the consent form.  The subject verbalized understanding of the trial requirements.  The subject agreed to participate in the IDENTIFY trial and signed the informed consent.  The informed consent was obtained prior to performance of any protocol-specific procedures for the subject.  A copy of the signed informed consent was given to the subject and a copy was placed in the subject's medical record.    , Research Assistant  04/21/2020  12:10 p.m.   

## 2020-04-21 NOTE — Discharge Instructions (Addendum)
  [facility]   Discharge Instructions  Endovascular Aortic Aneurysm Repair  Please refer to the following instructions for your post-procedure care. Your surgeon or Physician Assistant will discuss any changes with you.  Activity  You are encouraged to walk as much as you can. You can slowly return to normal activities but must avoid strenuous activity and heavy lifting until your doctor tells you it's OK. Avoid activities such as vacuuming or swinging a gold club. It is normal to feel tired for several weeks after your surgery. Do not drive until your doctor gives the OK and you are no longer taking prescription pain medications. It is also normal to have difficulty with sleep habits, eating, and bowel movements after surgery. These will go away with time.  Bathing/Showering  Shower daily after you go home.  Do not soak in a bathtub, hot tub, or swim until the incision heals completely.  If you have incisions in your groin, wash the groin wounds with soap and water daily and pat dry. (No tub bath-only shower)  Then put a dry gauze or washcloth there to keep this area dry to help prevent wound infection daily and as needed.  Do not use Vaseline or neosporin on your incisions.  Only use soap and water on your incisions and then protect and keep dry.  Incision Care  Shower every day. Clean your incision with mild soap and water. Pat the area dry with a clean towel. You do not need a bandage unless otherwise instructed. Do not apply any ointments or creams to your incision. If you clothing is irritating, you may cover your incision with a dry gauze pad.  Diet  Resume your normal diet. There are no special food restrictions following this procedure. A low fat/low cholesterol diet is recommended for all patients with vascular disease. In order to heal from your surgery, it is CRITICAL to get adequate nutrition. Your body requires vitamins, minerals, and protein.  Vegetables are the best source of vitamins and minerals. Vegetables also provide the perfect balance of protein. Processed food has little nutritional value, so try to avoid this.  Medications  Resume taking all of your medications unless your doctor or nurse practitioner tells you not to. If your incision is causing pain, you may take over-the-counter pain relievers such as acetaminophen (Tylenol). If you were prescribed a stronger pain medication, please be aware these medications can cause nausea and constipation. Prevent nausea by taking the medication with a snack or meal. Avoid constipation by drinking plenty of fluids and eating foods with a high amount of fiber, such as fruits, vegetables, and grains.  Do not take Tylenol if you are taking prescription pain medications.   Follow up  Our office will schedule a follow-up appointment with a CT scan 3-4 weeks after your surgery.  Please call us immediately for any of the following conditions  Severe or worsening pain in your legs or feet or in your abdomen back or chest. Increased pain, redness, drainage (pus) from your incision site. Increased abdominal pain, bloating, nausea, vomiting or persistent diarrhea. Fever of 101 degrees or higher. Swelling in your leg (s),  Reduce your risk of vascular disease  Stop smoking. If you would like help call [facility] at 1-800-QUIT-NOW (1-800-784-8669) or [place] at [phone]. Manage your cholesterol Maintain a desired weight Control your diabetes Keep your blood pressure down  If you have questions, please call the office at [phone].  

## 2020-04-21 NOTE — Consult Note (Addendum)
ASSESSMENT & PLAN:  Brett Cervantes is a 60 y.o. male with a descending thoracic aortic aneurysm measuring 54mm in greatest orthogonal dimension. He is presently asymptomatic from this aneurysm.  The patient is a candidate for elective repair of the aneurysm to prevent rupture.  I explained the risks / benefits / alternatives to different approaches to aortic reconstruction.   I explained the specific benefits of endovascular repair including limited physiologic stress and less recovery time, lower risk of serious perioperative complication.  I quoted him a less than 5% risk of perioperative stroke from TEVAR.  I quoted him a less than small risk of spinal cord ischemia which is modified by the length of treatment required. I quoted him a 20-30% risk of rupture annually.  I counseled the patient that he would likely need a left carotid subclavian bypass to allow coverage of the left subclavian artery to achieve proximal seal.  I have tentatively posted the patient for TEVAR and left carotid subclavian bypass Monday, 04/24/2020.  He will need to complete his cardiac work-up.  We will ask cardiologist to risk stratify him for the above.  Will plan to admit the patient for observation.   CHIEF COMPLAINT:   Referral from imaging center for descending thoracic aortic aneurysm  HISTORY:  HISTORY OF PRESENT ILLNESS: Brett Cervantes is a 60 y.o. male who presents from outpatient imaging center where incidental descending thoracic aortic aneurysm was identified.  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  He reported his symptoms to his primary care doctor who felt it likely to be due to costochondritis.  He got minimal relief from a trial of NSAID.  He was referred to Dr. Flora Lipps to rule out a cardiac source.  A CTA of his coronaries was planned, but aborted because of incidental note of a descending  thoracic aortic aneurysm.  A CTA of his chest abdomen pelvis was performed instead.  This revealed a descending thoracic aortic aneurysm measuring 78 mm in greatest dimension.  On my evaluation, the patient is asymptomatic.  He has no chest discomfort.    We spent 15 minutes discussing the natural history of aneurysms, the treatment options for aneurysms, the risks to the proposed treatment option, and the benefits of the proposed treatment.  Past Medical History:  Diagnosis Date  . Hypertension     History reviewed. No pertinent surgical history.  Family History  Problem Relation Age of Onset  . Heart attack Father     Social History   Socioeconomic History  . Marital status: Married    Spouse name: Not on file  . Number of children: Not on file  . Years of education: Not on file  . Highest education level: Not on file  Occupational History  . Occupation: Holiday representative   Tobacco Use  . Smoking status: Never Smoker  . Smokeless tobacco: Never Used  Substance and Sexual Activity  . Alcohol use: Yes    Alcohol/week: 2.0 standard drinks    Types: 2 Cans of beer per week  . Drug use: No  . Sexual activity: Not on file  Other Topics Concern  . Not on file  Social History Narrative  . Not on file   Social Determinants of Health   Financial Resource Strain: Not on file  Food Insecurity: Not on file  Transportation Needs: Not on file  Physical Activity: Not on file  Stress: Not on file  Social Connections: Not on file  Intimate Partner Violence: Not on file    No Known Allergies  No current facility-administered medications for this encounter.   Current Outpatient Medications  Medication Sig Dispense Refill  . amLODipine (NORVASC) 5 MG tablet Take 5 mg by mouth daily.    . metoprolol tartrate (LOPRESSOR) 100 MG tablet Take 1 tablet by mouth once for procedure. 1 tablet 0  . naproxen (NAPROSYN) 500 MG tablet Take 1 tablet (500 mg total) by mouth 2 (two) times daily  with a meal. For 7 days 14 tablet 0   Facility-Administered Medications Ordered in Other Encounters  Medication Dose Route Frequency Provider Last Rate Last Admin  . nitroGLYCERIN (NITROSTAT) 0.4 MG SL tablet             REVIEW OF SYSTEMS:  [X]  denotes positive finding, [ ]  denotes negative finding Cardiac  Comments:  Chest pain or chest pressure:    Shortness of breath upon exertion:    Short of breath when lying flat:    Irregular heart rhythm:        Vascular    Pain in calf, thigh, or hip brought on by ambulation:    Pain in feet at night that wakes you up from your sleep:     Blood clot in your veins:    Leg swelling:         Pulmonary    Oxygen at home:    Productive cough:     Wheezing:         Neurologic    Sudden weakness in arms or legs:     Sudden numbness in arms or legs:     Sudden onset of difficulty speaking or slurred speech:    Temporary loss of vision in one eye:     Problems with dizziness:         Gastrointestinal    Blood in stool:     Vomited blood:         Genitourinary    Burning when urinating:     Blood in urine:        Psychiatric    Major depression:         Hematologic    Bleeding problems:    Problems with blood clotting too easily:        Skin    Rashes or ulcers:        Constitutional    Fever or chills:     PHYSICAL EXAM:   Vitals:   04/21/20 1407 04/21/20 1408 04/21/20 1500  BP: 129/83 (!) 133/93 136/89  Pulse: 68  70  Resp: 16  17  Temp: 97.8 F (36.6 C)    TempSrc: Oral    SpO2: 100%  99%   Constitutional: well appearing in no distress. Appears well nourished.  Neurologic: CN intact. No focal findings. No sensory loss. Psychiatric: Mood and affect symmetric and appropriate. Eyes: No icterus. No conjunctival pallor. Ears, nose, throat: mucous membranes moist. Midline trachea.  Cardiac: regular rate and rhythm.  Respiratory: unlabored. Abdominal: soft, non-tender, non-distended.  Peripheral vascular:  2+  radial pulses bilaterally  2+ Dps bilaterally Extremity: No edema. No cyanosis. No pallor.  Skin: No gangrene. No ulceration.  Lymphatic: No Stemmer's sign. No palpable lymphadenopathy.   DATA REVIEW:    Most recent CBC CBC Latest Ref Rng & Units 04/21/2020 04/10/2020 09/02/2016  WBC 4.0 - 10.5 K/uL 9.9 4.9 8.5  Hemoglobin 13.0 - 17.0 g/dL  13.9 14.9 14.3  Hematocrit 39.0 - 52.0 % 41.2 44.2 42.3  Platelets 150 - 400 K/uL 164 184 179     Most recent CMP CMP Latest Ref Rng & Units 04/21/2020 04/10/2020 09/02/2016  Glucose 70 - 99 mg/dL 97 97 885(O)  BUN 6 - 20 mg/dL 27(X) 17 20  Creatinine 0.61 - 1.24 mg/dL 4.12 8.78 6.76  Sodium 135 - 145 mmol/L 135 140 140  Potassium 3.5 - 5.1 mmol/L 4.0 5.4(H) 3.9  Chloride 98 - 111 mmol/L 103 103 107  CO2 22 - 32 mmol/L 24 24 25   Calcium 8.9 - 10.3 mg/dL ) 9.5 8.9  Total Protein 6.5 - 8.1 g/dL - - 7.0  Total Bilirubin 0.3 - 1.2 mg/dL - - 0.9  Alkaline Phos 38 - 126 U/L - - 60  AST 15 - 41 U/L - - 26  ALT 17 - 63 U/L - - 32    Renal function Estimated Creatinine Clearance: 72.7 mL/min (by C-G formula based on SCr of 1.13 mg/dL).  No results found for: HGBA1C  No results found for: LDLCALC, LDLC, HIRISKLDL, POCLDL, LDLDIRECT, REALLDLC, TOTLDLC   TA chest abdomen pelvis 04/21/2020 Personally reviewed Large descending thoracic aortic aneurysm arising less than 2 cm from the origin of the left subclavian artery Ample distal landing zone many centimeters above the origin of the celiac axis. The aorta is somewhat angulated  04/23/2020. [name], MD [facility] Phone Number: [phone] 04/21/2020 3:15 PM

## 2020-04-21 NOTE — Medical Student Note (Signed)
[facility]-EMERGENCY DEPT Provider Student Note For educational purposes for Medical, PA and NP students only and not part of the legal medical record.   CSN: [id number] Arrival date & time: 04/21/20  1350      History   Chief Complaint Chief Complaint  Patient presents with  . Chest Pain    HPI Brett Cervantes is a 60 y.o. male with PMH of hypertension sent to ED from CT scan for concern of aortic dissection.  States that since early December he has been having exertional chest pain. 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 Got in his car and states the pain went away. This happened off and on until he came home from work and CP did not subside with rest. He went to his PCP who referred him to cardiology here at [facility]. He initially saw Cardiology 04/10/2020.   At appointment EKG done which did not show acute ischemic changes. They recommended him have a coronary CTA to further evaluate chest pain.   Today he came to [facility] for coronary CTA. They administered 100mg  oral metoprolol and began exam. On imaging they noted a large aortic dissection. He was sent to ED from scan. He does not complain of chest pain, shortness of breath, palpitations or back pain. He does not complain of abdominal pain, numbness or tingling in bilateral upper or lower extremities. He denies any dizziness or lightheadedness. He is not anticoagulated.  States he does not smoke, and drink 1-2 beers/night.   Past Medical History:  Diagnosis Date  . Hypertension     There are no problems to display for this patient.   History reviewed. No pertinent surgical history.   Home Medications    Prior to Admission medications   Medication Sig Start Date End Date Taking? Authorizing Provider  amLODipine (NORVASC) 5 MG tablet Take 5 mg by mouth daily. 03/21/20   [provider]  metoprolol tartrate (LOPRESSOR) 100 MG tablet Take 1 tablet by mouth once for procedure. 04/10/20   06/08/20, MD  naproxen (NAPROSYN) 500 MG tablet Take 1 tablet (500 mg total) by mouth 2 (two) times daily with a meal. For 7 days 04/10/20   O'Neal, 06/08/20, MD    Family History Family History  Problem Relation Age of Onset  . Heart attack Father     Social History Social History   Tobacco Use  . Smoking status: Never Smoker  . Smokeless tobacco: Never Used  Substance Use Topics  . Alcohol use: Yes    Alcohol/week: 2.0 standard drinks    Types: 2 Cans of beer per week  . Drug use: No     Allergies   Patient has no known allergies.   Review of Systems Review of Systems  Constitutional: Negative for diaphoresis, fatigue and fever.  HENT: Negative.   Eyes: Negative.   Respiratory: Negative for chest tightness and shortness of breath.   Cardiovascular: Negative for chest pain, palpitations and leg swelling.  Gastrointestinal: Negative for abdominal distention, abdominal pain, nausea and vomiting.  Endocrine: Negative.   Genitourinary: Negative.   Musculoskeletal: Negative for back pain.  Skin: Negative.   Allergic/Immunologic: Negative.   Neurological: Negative.   Hematological: Negative.      Physical Exam Updated Vital Signs BP (!) 133/93 (BP Location: Right Arm)   Pulse 68   Temp 97.8 F (36.6 C) (Oral)   Resp 16   SpO2 100%   Physical Exam Constitutional:  General: He is not in acute distress.    Appearance: He is not ill-appearing or diaphoretic.  HENT:     Head: Normocephalic and atraumatic.  Eyes:     Extraocular Movements: Extraocular movements intact.     Pupils: Pupils are equal, round, and reactive to light.  Cardiovascular:     Rate and Rhythm: Normal rate and regular rhythm.     Pulses:          Radial pulses are 2+ on the right side and 2+ on the left side.       Posterior tibial pulses are 2+ on the right side and 2+ on the left side.     Heart sounds: Normal heart sounds. No murmur heard.   Pulmonary:     Effort:  Pulmonary effort is normal.  Abdominal:     Palpations: There is no mass.  Musculoskeletal:     Cervical back: Normal range of motion.  Skin:    General: Skin is warm and dry.     Capillary Refill: Capillary refill takes less than 2 seconds.  Neurological:     General: No focal deficit present.     Mental Status: He is alert.    ED Treatments / Results  Labs (all labs ordered are listed, but only abnormal results are displayed) Labs Reviewed  BASIC METABOLIC PANEL - Abnormal; Notable for the following components:      Result Value   BUN 21 (*)    Calcium 8.6 (*)    All other components within normal limits  SARS CORONAVIRUS 2 (TAT 6-24 HRS)  CBC  PROTIME-INR   EKG  Radiology CT Angio Chest/Abd/Pel for Dissection W and/or W/WO  Result Date: 04/21/2020 CLINICAL DATA:  Anginal chest pain EXAM: CT ANGIOGRAPHY CHEST, ABDOMEN AND PELVIS TECHNIQUE: Non-contrast CT of the chest was initially obtained. Multidetector CT imaging through the chest, abdomen and pelvis was performed using the standard protocol during bolus administration of intravenous contrast. Multiplanar reconstructed images and MIPs were obtained and reviewed to evaluate the vascular anatomy. CONTRAST:  66mL OMNIPAQUE IOHEXOL 350 MG/ML SOLN COMPARISON:  None. FINDINGS: CTA CHEST FINDINGS Cardiovascular: Heart size within normal limits. Left anterior descending coronary artery calcifications are seen. Mediastinum/Nodes: No enlarged mediastinal, hilar, or axillary lymph nodes. Thyroid gland, trachea, and esophagus demonstrate no significant findings. Lungs/Pleura: Lungs are clear. Musculoskeletal: No acute abnormality. There are is anterior wedging of multiple midthoracic vertebral bodies with exaggeration of thoracic kyphosis suspicious for Scheuermann's disease. Review of the MIP images confirms the above findings. CTA ABDOMEN AND PELVIS FINDINGS VASCULAR Aorta: The aortic valve is tricuspid. No significant dilatation of the  aortic root or ascending thoracic aorta. Fusiform aneurysmal dilatation of the descending thoracic aorta with maximum diameter measuring 7.8 x 7.6 cm. Celiac: Patent without evidence of aneurysm, dissection, vasculitis or significant stenosis. SMA: Patent without evidence of aneurysm, dissection, vasculitis or significant stenosis. Renals: Both renal arteries are patent without evidence of aneurysm, dissection, vasculitis, fibromuscular dysplasia or significant stenosis. IMA: Patent without evidence of aneurysm, dissection, vasculitis or significant stenosis. Inflow: Patent without evidence of aneurysm, dissection, vasculitis or significant stenosis. Veins: No obvious venous abnormality within the limitations of this arterial phase study. Review of the MIP images confirms the above findings. NON-VASCULAR Hepatobiliary: 8 mm hypodense lesion in the medial right hepatic lobe is too small to fully characterize given subcentimeter size. Liver and gallbladder otherwise unremarkable. No biliary ductal dilatation. Pancreas: Unremarkable. No pancreatic ductal dilatation or surrounding inflammatory changes. Spleen: Normal in  size without focal abnormality. Adrenals/Urinary Tract: Adrenal glands are normal. 10 mm simple cyst present in the upper pole of the right kidney. Kidneys are otherwise normal. Bladder is normal. Stomach/Bowel: Descending and sigmoid colon diverticulosis without evidence of acute diverticulitis. Appendix is normal. No dilated loops of bowel to indicate ileus or obstruction. Lymphatic: No enlarged abdominal or pelvic lymph nodes. Reproductive: Prostate is moderately enlarged. Other: No abdominal wall hernia or abnormality. No abdominopelvic ascites. Musculoskeletal: Mild degenerative changes of the hips bilaterally. Review of the MIP images confirms the above findings. IMPRESSION: Aneurysmal dilatation of the descending thoracic aorta measuring up to 7.8 x 7.6 cm. These results were called by telephone  at the time of interpretation on 04/21/2020 at 2:15 pm to provider Dr. Margarita Grizzle , who verbally acknowledged these results. Electronically Signed   By: Acquanetta Belling M.D.   On: 04/21/2020 14:43   Procedures Procedures (including critical care time)  Medications Ordered in ED Medications - No data to display   Initial Impression / Assessment and Plan / ED Course  I have reviewed the triage vital signs and the nursing notes.  Pertinent labs & imaging results that were available during my care of the patient were reviewed by me and considered in my medical decision making (see chart for details).  Melody Savidge is a 63ZCH sent for aortic aneurysm and dissection found on CT imaging today. He is current HDS on room air. Pulses are equal. No abdominal pulsating mass on palpation.   Will admit and consult with cardiology and CT surgery  Final Clinical Impressions(s) / ED Diagnoses   Final diagnoses:  None    New Prescriptions New Prescriptions   No medications on file

## 2020-04-21 NOTE — ED Provider Notes (Addendum)
Care transferred to me. Patient remains asymptomatic. Dr. [name] has seen patient and will admit and do surgery in a couple days.   Pricilla Loveless, MD 04/21/20 [phone]

## 2020-04-21 NOTE — Progress Notes (Signed)
Called by CT regarding aortic aneurysm of the descending aorta up to 7.5 cm with concerns for intramural hematoma. Protocol was changed to gated CTA for the entire aorta. Discussed with Brett Cervantes that given his intermittent symptoms of chest pain and large aneurysm of the descending aorta, I recommended emergency room evaluation. He was in agreement.   Gerri Spore T. Flora Lipps, MD, [facility]  [facility]  [address] [place], [place] [address] [phone]  1:55 PM

## 2020-04-21 NOTE — Progress Notes (Signed)
After initial scan for CT heart, some abnormality was noted and Dr Flora Lipps was called. Dr Flora Lipps came to CT control room and the decision was made to cancel ct heart scan and continue with other CT scans per Dr Flora Lipps orders. After scan, pt was assisted off of ct table to wheelchair and transported to trauma A where report was given to Dr Rosalia Hammers. Pt safely moved from wheelchair to stretcher without issue.  Pt remained alert and oriented x4 throughout the process. Pt verbalized understanding of the emergent need for care and was agreeable to go to the emergency dept to be evaluated.  Pt is concerned that he has the only vehicle in the household here and that may limit transportation for his wife and daughter. Explained to pt the necessity of emergency care at this time. He verbalized understanding.

## 2020-04-21 NOTE — ED Provider Notes (Signed)
[facility] EMERGENCY DEPARTMENT Provider Note   CSN: [id number] Arrival date & time: 04/21/20  1350     History No chief complaint on file.   Brett Cervantes is a 60 y.o. male.  HPI 60 year old male sent to the ED from radiology.  Patient was having a CT coronary artery study done today due to intermittent chest pain since December.  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 Pain resolved with rest for weeks.  Then on day wouldn't stop and took meloxicam.  Seen by pmd and referred to cardiology and seen on 04/10/20 and scheduled for ct coronaries today.  He is currently pain-free.  He received beta-blockers prior to study and nitroglycerin prior to study.  CT appeared to have large aneurysm versus dissection and patient sent to ED.  He is not on any anticoagulation.   Discussed ct with Dr. , radiology- ct 7.8 cm appears to be all descending and aneurysmal. Past Medical History:  Diagnosis Date  . Hypertension     There are no problems to display for this patient.   No past surgical history on file.     Family History  Problem Relation Age of Onset  . Heart attack Father     Social History   Tobacco Use  . Smoking status: Never Smoker  . Smokeless tobacco: Never Used  Substance Use Topics  . Alcohol use: Yes    Alcohol/week: 2.0 standard drinks    Types: 2 Cans of beer per week  . Drug use: No    Home Medications Prior to Admission medications   Medication Sig Start Date End Date Taking? Authorizing Provider  amLODipine (NORVASC) 5 MG tablet Take 5 mg by mouth daily. 03/21/20   [provider]  metoprolol tartrate (LOPRESSOR) 100 MG tablet Take 1 tablet by mouth once for procedure. 04/10/20   Sande Rives, MD  naproxen (NAPROSYN) 500 MG tablet Take 1 tablet (500 mg total) by mouth 2 (two) times daily with a meal. For 7 days 04/10/20   O'Neal, Ronnald Ramp, MD    Allergies    Patient has no known  allergies.  Review of Systems   Review of Systems  Physical Exam Updated Vital Signs There were no vitals taken for this visit.  Physical Exam Vitals and nursing note reviewed.  HENT:     Head: Normocephalic.     Right Ear: External ear normal.     Left Ear: External ear normal.     Nose: Nose normal.     Mouth/Throat:     Pharynx: Oropharynx is clear.  Eyes:     Extraocular Movements: Extraocular movements intact.     Pupils: Pupils are equal, round, and reactive to light.  Cardiovascular:     Rate and Rhythm: Normal rate and regular rhythm.     Pulses:          Carotid pulses are 1+ on the right side and 1+ on the left side.      Radial pulses are 1+ on the right side and 1+ on the left side.       Dorsalis pedis pulses are 1+ on the right side and 1+ on the left side.     Heart sounds: Normal heart sounds.  Pulmonary:     Effort: Pulmonary effort is normal.     Breath sounds: Normal breath sounds.  Abdominal:     Palpations: Abdomen is soft.  Musculoskeletal:  General: Normal range of motion.     Cervical back: Normal range of motion.  Skin:    General: Skin is warm and dry.     Capillary Refill: Capillary refill takes less than 2 seconds.  Neurological:     General: No focal deficit present.     Mental Status: He is alert.     Motor: No weakness.  Psychiatric:        Mood and Affect: Mood normal.        Behavior: Behavior normal.     ED Results / Procedures / Treatments   Labs (all labs ordered are listed, but only abnormal results are displayed) Labs Reviewed  CBC  BASIC METABOLIC PANEL  PROTIME-INR    EKG EKG Interpretation  Date/Time:  Friday April 21 2020 14:09:42 EST Ventricular Rate:  69 PR Interval:    QRS Duration: 101 QT Interval:  397 QTC Calculation: 426 R Axis:   -61 Text Interpretation: Sinus rhythm LAD, consider left anterior fascicular block Confirmed by Margarita Grizzle [phone]) on 04/21/2020 3:29:17 PM   Radiology CT  Angio Chest/Abd/Pel for Dissection W and/or W/WO  Result Date: 04/21/2020 CLINICAL DATA:  Anginal chest pain EXAM: CT ANGIOGRAPHY CHEST, ABDOMEN AND PELVIS TECHNIQUE: Non-contrast CT of the chest was initially obtained. Multidetector CT imaging through the chest, abdomen and pelvis was performed using the standard protocol during bolus administration of intravenous contrast. Multiplanar reconstructed images and MIPs were obtained and reviewed to evaluate the vascular anatomy. CONTRAST:  63mL OMNIPAQUE IOHEXOL 350 MG/ML SOLN COMPARISON:  None. FINDINGS: CTA CHEST FINDINGS Cardiovascular: Heart size within normal limits. Left anterior descending coronary artery calcifications are seen. Mediastinum/Nodes: No enlarged mediastinal, hilar, or axillary lymph nodes. Thyroid gland, trachea, and esophagus demonstrate no significant findings. Lungs/Pleura: Lungs are clear. Musculoskeletal: No acute abnormality. There are is anterior wedging of multiple midthoracic vertebral bodies with exaggeration of thoracic kyphosis suspicious for Scheuermann's disease. Review of the MIP images confirms the above findings. CTA ABDOMEN AND PELVIS FINDINGS VASCULAR Aorta: The aortic valve is tricuspid. No significant dilatation of the aortic root or ascending thoracic aorta. Fusiform aneurysmal dilatation of the descending thoracic aorta with maximum diameter measuring 7.8 x 7.6 cm. Celiac: Patent without evidence of aneurysm, dissection, vasculitis or significant stenosis. SMA: Patent without evidence of aneurysm, dissection, vasculitis or significant stenosis. Renals: Both renal arteries are patent without evidence of aneurysm, dissection, vasculitis, fibromuscular dysplasia or significant stenosis. IMA: Patent without evidence of aneurysm, dissection, vasculitis or significant stenosis. Inflow: Patent without evidence of aneurysm, dissection, vasculitis or significant stenosis. Veins: No obvious venous abnormality within the limitations  of this arterial phase study. Review of the MIP images confirms the above findings. NON-VASCULAR Hepatobiliary: 8 mm hypodense lesion in the medial right hepatic lobe is too small to fully characterize given subcentimeter size. Liver and gallbladder otherwise unremarkable. No biliary ductal dilatation. Pancreas: Unremarkable. No pancreatic ductal dilatation or surrounding inflammatory changes. Spleen: Normal in size without focal abnormality. Adrenals/Urinary Tract: Adrenal glands are normal. 10 mm simple cyst present in the upper pole of the right kidney. Kidneys are otherwise normal. Bladder is normal. Stomach/Bowel: Descending and sigmoid colon diverticulosis without evidence of acute diverticulitis. Appendix is normal. No dilated loops of bowel to indicate ileus or obstruction. Lymphatic: No enlarged abdominal or pelvic lymph nodes. Reproductive: Prostate is moderately enlarged. Other: No abdominal wall hernia or abnormality. No abdominopelvic ascites. Musculoskeletal: Mild degenerative changes of the hips bilaterally. Review of the MIP images confirms the above findings. IMPRESSION: Aneurysmal  dilatation of the descending thoracic aorta measuring up to 7.8 x 7.6 cm. These results were called by telephone at the time of interpretation on 04/21/2020 at 2:15 pm to provider Dr. Margarita Grizzle , who verbally acknowledged these results. Electronically Signed   By: Acquanetta Belling M.D.   On: 04/21/2020 14:43    Procedures .Critical Care Performed by: Margarita Grizzle, MD Authorized by: Margarita Grizzle, MD   Critical care provider statement:    Critical care time (minutes):  45   Critical care end time:  04/21/2020 3:30 PM   Critical care was time spent personally by me on the following activities:  Discussions with consultants, evaluation of patient's response to treatment, examination of patient, ordering and performing treatments and interventions, ordering and review of laboratory studies, ordering and review of  radiographic studies, pulse oximetry, re-evaluation of patient's condition, obtaining history from patient or surrogate and review of old charts     Medications Ordered in ED Medications - No data to display  ED Course  I have reviewed the triage vital signs and the nursing notes.  Pertinent labs & imaging results that were available during my care of the patient were reviewed by me and considered in my medical decision making (see chart for details).    MDM Rules/Calculators/A&P                          60 year old male has been having some intermittent chest pain presents today for coronary artery CT.  He was noted on films to have a large thoracic aneurysm transferred to the ED for further evaluation.  He has been hemodynamically stable and pain-free here in the ED.  CT reviewed and results discussed with Dr. Bryn Gulling. patient discussed with Dr. Tyrone Sage and [name].  Dr. Sharene Skeans is coming to evaluate patient.  Final Clinical Impression(s) / ED Diagnoses Final diagnoses:  Descending thoracic aortic aneurysm [facility])    Rx / DC Orders ED Discharge Orders    None       Margarita Grizzle, MD 04/21/20 1534

## 2020-04-21 NOTE — Progress Notes (Signed)
Pt received from ED. VSS. CHG complete.  Telemetry applied. Pt oriented to room and unit. Call light in reach. Will continue to monitor.  Versie Starks, RN e

## 2020-04-21 NOTE — ED Triage Notes (Signed)
Pt arrived from outpatient CT, was having routine cardiac CT for chest pressure since December. Brought over for further evaluation. Denies pain and pressure at this time. Pt is A/Ox4. NAD.

## 2020-04-21 NOTE — H&P (Signed)
See consult note from 04/21/20 for full H&P details.  [name]. [name], MD [facility] Phone Number: [phone] 04/21/2020 7:24 PM

## 2020-04-22 DIAGNOSIS — I7101 Dissection of thoracic aorta: Secondary | ICD-10-CM | POA: Diagnosis not present

## 2020-04-22 DIAGNOSIS — I10 Essential (primary) hypertension: Secondary | ICD-10-CM | POA: Diagnosis not present

## 2020-04-22 DIAGNOSIS — I2510 Atherosclerotic heart disease of native coronary artery without angina pectoris: Secondary | ICD-10-CM | POA: Diagnosis not present

## 2020-04-22 DIAGNOSIS — I712 Thoracic aortic aneurysm, without rupture: Secondary | ICD-10-CM | POA: Diagnosis not present

## 2020-04-22 MED ORDER — METOPROLOL SUCCINATE ER 25 MG PO TB24
25.0000 mg | ORAL_TABLET | Freq: Every day | ORAL | Status: DC
  Administered 2020-04-22 – 2020-04-25 (×3): 25 mg via ORAL
  Filled 2020-04-22 (×3): qty 1

## 2020-04-22 NOTE — Progress Notes (Addendum)
  Progress Note    04/22/2020 8:45 AM * No surgery found *  Subjective: no complaints   Vitals:   04/22/20 0347 04/22/20 0750  BP: 103/74 125/83  Pulse: 69 70  Resp: 17 19  Temp: 97.9 F (36.6 C) 98 F (36.7 C)  SpO2: 98% 97%   Physical Exam: Cardiac: regular Lungs: non labored Extremities: 2+ radial pulses, 2+ femoral and DP pulses bilaterally. Extremities are well perfused and warm Abdomen: flat, soft, non distended, non tender Neurologic: alert and oriented  CBC    Component Value Date/Time   WBC 8.8 04/21/2020 1606   RBC 4.85 04/21/2020 1606   HGB 15.0 04/21/2020 1606   HGB 14.9 04/10/2020 0854   HCT 45.7 04/21/2020 1606   HCT 44.2 04/10/2020 0854   PLT 161 04/21/2020 1606   PLT 184 04/10/2020 0854   MCV 94.2 04/21/2020 1606   MCV 93 04/10/2020 0854   MCH 30.9 04/21/2020 1606   MCHC 32.8 04/21/2020 1606   RDW 11.5 04/21/2020 1606   RDW 11.5 (L) 04/10/2020 0854   LYMPHSABS 1.0 09/02/2016 1514   MONOABS 0.6 09/02/2016 1514   EOSABS 0.1 09/02/2016 1514   BASOSABS 0.0 09/02/2016 1514    BMET    Component Value Date/Time   NA 135 04/21/2020 1405   NA 140 04/10/2020 0854   K 4.0 04/21/2020 1405   CL 103 04/21/2020 1405   CO2 24 04/21/2020 1405   GLUCOSE 97 04/21/2020 1405   BUN 21 (H) 04/21/2020 1405   BUN 17 04/10/2020 0854   CREATININE 1.13 04/21/2020 1405   CALCIUM 8.6 (L) 04/21/2020 1405   GFRNONAA >60 04/21/2020 1405   GFRAA 83 04/10/2020 0854    INR    Component Value Date/Time   INR 1.0 04/21/2020 1606     Intake/Output Summary (Last 24 hours) at 04/22/2020 0845 Last data filed at 04/21/2020 2200 Gross per 24 hour  Intake 240 ml  Output --  Net 240 ml     Assessment/Plan:  60 y.o. male with descending thoracici aortic aneurysm. Presently without significant pain. Blood pressure well maintained. Upper and lower extremities are well perfused. Hemodynamically stable. Appreciate cardiology assistance. Per cardiology pre op evaluation  he is acceptable risk for surgery. Tentatively planned to have TEVAR and left carotid subclavian bypass on Monday 04/24/20  DVT prophylaxis:  Sq. heparin   Graceann Congress, PA-C [facility] [phone] 04/22/2020 8:45 AM   VASCULAR STAFF ADDENDUM: I have independently interviewed and examined the patient. I agree with the above.  LCCA - LSCA bypass and Zone 2 TEVAR 04/24/20  [name]. [name], MD [facility] Phone Number: [phone] 04/22/2020 9:43 AM

## 2020-04-22 NOTE — Progress Notes (Signed)
DAILY PROGRESS NOTE   Patient Name: Brett Cervantes Date of Encounter: 04/22/2020 Cardiologist: No primary care provider on file.  Chief Complaint   No chest pain  Patient Profile   60 yo male with recent evaluation for chest pain, thought to be atypical, sent for coronary CTA and found to have a large 7.8 cm descending thoracic aortic aneurysm. Cardiology is asked to evaluate for preoperative risk.  Subjective   No issues overnight - plan for TEVAR on Monday. BP normal - HR in the 60's-70's.  Objective   Vitals:   04/21/20 2005 04/21/20 2335 04/22/20 0347 04/22/20 0750  BP: 129/80 119/79 103/74 125/83  Pulse: 65 66 69 70  Resp: 20 18 17 19   Temp: 98.5 F (36.9 C) 98.5 F (36.9 C) 97.9 F (36.6 C) 98 F (36.7 C)  TempSrc: Oral Oral Oral Oral  SpO2: 98% 98% 98% 97%  Weight:      Height:        Intake/Output Summary (Last 24 hours) at 04/22/2020 1051 Last data filed at 04/21/2020 2200 Gross per 24 hour  Intake 240 ml  Output --  Net 240 ml   Filed Weights   04/21/20 1900  Weight: 80.1 kg    Physical Exam   General appearance: alert and no distress Neck: no carotid bruit, no JVD and thyroid not enlarged, symmetric, no tenderness/mass/nodules Lungs: clear to auscultation bilaterally Heart: regular rate and rhythm, S1, S2 normal, no murmur, click, rub or gallop Abdomen: soft, non-tender; bowel sounds normal; no masses,  no organomegaly Extremities: extremities normal, atraumatic, no cyanosis or edema Pulses: 2+ and symmetric Skin: Skin color, texture, turgor normal. No rashes or lesions Neurologic: Grossly normal Psych: Pleasant  Inpatient Medications    Scheduled Meds: . heparin  5,000 Units Subcutaneous Q8H  . pantoprazole  40 mg Oral Daily  . potassium chloride  20-40 mEq Oral Once  . sodium chloride flush  3 mL Intravenous Q12H    Continuous Infusions: . sodium chloride      PRN Meds: sodium chloride, acetaminophen **OR** acetaminophen, alum  & mag hydroxide-simeth, guaiFENesin-dextromethorphan, hydrALAZINE, labetalol, metoprolol tartrate, morphine injection, ondansetron, oxyCODONE-acetaminophen, phenol, senna-docusate, sodium chloride flush   Labs   Results for orders placed or performed during the hospital encounter of 04/21/20 (from the past 48 hour(s))  CBC     Status: None   Collection Time: 04/21/20  2:05 PM  Result Value Ref Range   WBC 9.9 4.0 - 10.5 K/uL   RBC 4.37 4.22 - 5.81 MIL/uL   Hemoglobin 13.9 13.0 - 17.0 g/dL   HCT 04/23/20 69.6 - 78.9 %   MCV 94.3 80.0 - 100.0 fL   MCH 31.8 26.0 - 34.0 pg   MCHC 33.7 30.0 - 36.0 g/dL   RDW 38.1 01.7 - 51.0 %   Platelets 164 150 - 400 K/uL   nRBC 0.0 0.0 - 0.2 %    Comment: Performed at [facility], [address], [place], [place]  Basic metabolic panel     Status: Abnormal   Collection Time: 04/21/20  2:05 PM  Result Value Ref Range   Sodium 135 135 - 145 mmol/L   Potassium 4.0 3.5 - 5.1 mmol/L   Chloride 103 98 - 111 mmol/L   CO2 24 22 - 32 mmol/L   Glucose, Bld 97 70 - 99 mg/dL    Comment: Glucose reference range applies only to samples taken after fasting for at least 8 hours.  BUN 21 (H) 6 - 20 mg/dL   Creatinine, Ser 7.37 0.61 - 1.24 mg/dL   Calcium 8.6 (L) 8.9 - 10.3 mg/dL   GFR, Estimated >10 >62 mL/min    Comment: (NOTE) Calculated using the CKD-EPI Creatinine Equation (2021)    Anion gap 8 5 - 15    Comment: Performed at [facility], [address], [place], [place] [address]  Protime-INR     Status: None   Collection Time: 04/21/20  2:05 PM  Result Value Ref Range   Prothrombin Time 13.3 11.4 - 15.2 seconds   INR 1.1 0.8 - 1.2    Comment: (NOTE) INR goal varies based on device and disease states. Performed at [facility], [address], [place], [place] [address]   HIV Antibody (routine testing w rflx)     Status: None   Collection Time: 04/21/20  4:06 PM  Result Value Ref Range   HIV Screen 4th Generation wRfx  Non Reactive Non Reactive    Comment: Performed at [facility], [address], [place], [place] [address]  CBC     Status: None   Collection Time: 04/21/20  4:06 PM  Result Value Ref Range   WBC 8.8 4.0 - 10.5 K/uL   RBC 4.85 4.22 - 5.81 MIL/uL   Hemoglobin 15.0 13.0 - 17.0 g/dL   HCT 38.1 82.9 - 93.7 %   MCV 94.2 80.0 - 100.0 fL   MCH 30.9 26.0 - 34.0 pg   MCHC 32.8 30.0 - 36.0 g/dL   RDW 16.9 67.8 - 93.8 %   Platelets 161 150 - 400 K/uL   nRBC 0.0 0.0 - 0.2 %    Comment: Performed at [facility], [address] [place], [place] [address]  Protime-INR     Status: None   Collection Time: 04/21/20  4:06 PM  Result Value Ref Range   Prothrombin Time 12.8 11.4 - 15.2 seconds   INR 1.0 0.8 - 1.2    Comment: (NOTE) INR goal varies based on device and disease states. Performed at [facility], [address] [place], [place] [address]   SARS CORONAVIRUS 2 (TAT 6-24 HRS) Nasopharyngeal Nasopharyngeal Swab     Status: None   Collection Time: 04/21/20  4:17 PM   Specimen: Nasopharyngeal Swab  Result Value Ref Range   SARS Coronavirus 2 NEGATIVE NEGATIVE    Comment: (NOTE) SARS-CoV-2 target nucleic acids are NOT DETECTED.  The SARS-CoV-2 RNA is generally detectable in upper and lower respiratory specimens during the acute phase of infection. Negative results do not preclude SARS-CoV-2 infection, do not rule out co-infections with other pathogens, and should not be used as the sole basis for treatment or other patient management decisions. Negative results must be combined with clinical observations, patient history, and epidemiological information. The expected result is Negative.  Fact Sheet for Patients: HairSlick.no  Fact Sheet for Healthcare Providers: quierodirigir.com  This test is not yet approved or cleared by the [place] FDA and  has been authorized for detection and/or diagnosis of  SARS-CoV-2 by FDA under an Emergency Use Authorization (EUA). This EUA will remain  in effect (meaning this test can be used) for the duration of the COVID-19 declaration under Se ction 564(b)(1) of the Act, 21 U.S.C. section 360bbb-3(b)(1), unless the authorization is terminated or revoked sooner.  Performed at [facility], [address], [place], [place] [address]   Urinalysis, Routine w reflex  microscopic Nasopharyngeal Swab     Status: Abnormal   Collection Time: 04/21/20  4:19 PM  Result Value Ref Range   Color, Urine STRAW (A) YELLOW   APPearance CLEAR CLEAR   Specific Gravity, Urine 1.039 (H) 1.005 - 1.030   pH 6.0 5.0 - 8.0   Glucose, UA NEGATIVE NEGATIVE mg/dL   Hgb urine dipstick NEGATIVE NEGATIVE   Bilirubin Urine NEGATIVE NEGATIVE   Ketones, ur 5 (A) NEGATIVE mg/dL   Protein, ur NEGATIVE NEGATIVE mg/dL   Nitrite NEGATIVE NEGATIVE   Leukocytes,Ua NEGATIVE NEGATIVE    Comment: Performed at [facility], [address] [place], [place] [address]    ECG   N/A  Telemetry   Sinus rhythm - Personally Reviewed  Radiology    CT Angio Chest/Abd/Pel for Dissection W and/or W/WO  Result Date: 04/21/2020 CLINICAL DATA:  Anginal chest pain EXAM: CT ANGIOGRAPHY CHEST, ABDOMEN AND PELVIS TECHNIQUE: Non-contrast CT of the chest was initially obtained. Multidetector CT imaging through the chest, abdomen and pelvis was performed using the standard protocol during bolus administration of intravenous contrast. Multiplanar reconstructed images and MIPs were obtained and reviewed to evaluate the vascular anatomy. CONTRAST:  95mL OMNIPAQUE IOHEXOL 350 MG/ML SOLN COMPARISON:  None. FINDINGS: CTA CHEST FINDINGS Cardiovascular: Heart size within normal limits. Left anterior descending coronary artery calcifications are seen. Mediastinum/Nodes: No enlarged mediastinal, hilar, or axillary lymph nodes. Thyroid gland, trachea, and esophagus demonstrate no significant findings.  Lungs/Pleura: Lungs are clear. Musculoskeletal: No acute abnormality. There are is anterior wedging of multiple midthoracic vertebral bodies with exaggeration of thoracic kyphosis suspicious for Scheuermann's disease. Review of the MIP images confirms the above findings. CTA ABDOMEN AND PELVIS FINDINGS VASCULAR Aorta: The aortic valve is tricuspid. No significant dilatation of the aortic root or ascending thoracic aorta. Fusiform aneurysmal dilatation of the descending thoracic aorta with maximum diameter measuring 7.8 x 7.6 cm. Celiac: Patent without evidence of aneurysm, dissection, vasculitis or significant stenosis. SMA: Patent without evidence of aneurysm, dissection, vasculitis or significant stenosis. Renals: Both renal arteries are patent without evidence of aneurysm, dissection, vasculitis, fibromuscular dysplasia or significant stenosis. IMA: Patent without evidence of aneurysm, dissection, vasculitis or significant stenosis. Inflow: Patent without evidence of aneurysm, dissection, vasculitis or significant stenosis. Veins: No obvious venous abnormality within the limitations of this arterial phase study. Review of the MIP images confirms the above findings. NON-VASCULAR Hepatobiliary: 8 mm hypodense lesion in the medial right hepatic lobe is too small to fully characterize given subcentimeter size. Liver and gallbladder otherwise unremarkable. No biliary ductal dilatation. Pancreas: Unremarkable. No pancreatic ductal dilatation or surrounding inflammatory changes. Spleen: Normal in size without focal abnormality. Adrenals/Urinary Tract: Adrenal glands are normal. 10 mm simple cyst present in the upper pole of the right kidney. Kidneys are otherwise normal. Bladder is normal. Stomach/Bowel: Descending and sigmoid colon diverticulosis without evidence of acute diverticulitis. Appendix is normal. No dilated loops of bowel to indicate ileus or obstruction. Lymphatic: No enlarged abdominal or pelvic lymph  nodes. Reproductive: Prostate is moderately enlarged. Other: No abdominal wall hernia or abnormality. No abdominopelvic ascites. Musculoskeletal: Mild degenerative changes of the hips bilaterally. Review of the MIP images confirms the above findings. IMPRESSION: Aneurysmal dilatation of the descending thoracic aorta measuring up to 7.8 x 7.6 cm. These results were called by telephone at the time of interpretation on 04/21/2020 at 2:15 pm to provider Dr. Margarita Grizzleanielle Ray , who verbally acknowledged these results. Electronically Signed   By: Harrington ChallengerFarhaan  Mir M.D.  On: 04/21/2020 14:43    Cardiac Studies   See above  Assessment   1. Principal Problem: 2.   Descending thoracic aortic aneurysm (HCC) 3. Active Problems: 4.   Coronary artery calcification seen on CT scan 5.   Essential hypertension 6.   Plan   1. Large descending thoracic aortic aneurysm with intramural hematoma. There is mild non-obstructive calcified CAD of the proximal LAD. Ok for surgery Monday. Start Toprol XL 25 mg daily. Check fasting lipid profile in am, anticipate starting statin and eventually aspirin.  Time Spent Directly with Patient:  I have spent a total of 25 minutes with the patient reviewing hospital notes, telemetry, EKGs, labs and examining the patient as well as establishing an assessment and plan that was discussed personally with the patient.  > 50% of time was spent in direct patient care.  Length of Stay:  LOS: 1 day   Chrystie Nose, MD, [facility], FACP  [place]  [facility]  Medical Director of the [facility]  Cardiovascular Risk Reduction Clinic Diplomate of the American Board of Clinical Lipidology Attending Cardiologist  Direct Dial: [phone]  Fax: [phone]  Website:  [email or web address]  04/22/2020, 10:51 AM

## 2020-04-23 DIAGNOSIS — I2510 Atherosclerotic heart disease of native coronary artery without angina pectoris: Secondary | ICD-10-CM | POA: Diagnosis not present

## 2020-04-23 DIAGNOSIS — I7101 Dissection of thoracic aorta: Secondary | ICD-10-CM | POA: Diagnosis not present

## 2020-04-23 DIAGNOSIS — I712 Thoracic aortic aneurysm, without rupture: Secondary | ICD-10-CM | POA: Diagnosis not present

## 2020-04-23 LAB — BASIC METABOLIC PANEL
Anion gap: 9 (ref 5–15)
BUN: 27 mg/dL — ABNORMAL HIGH (ref 6–20)
CO2: 23 mmol/L (ref 22–32)
Calcium: 8.7 mg/dL — ABNORMAL LOW (ref 8.9–10.3)
Chloride: 106 mmol/L (ref 98–111)
Creatinine, Ser: 1.2 mg/dL (ref 0.61–1.24)
GFR, Estimated: >60 mL/min (ref >60)
Glucose, Bld: 99 mg/dL (ref 70–99)
Potassium: 4 mmol/L (ref 3.5–5.1)
Sodium: 138 mmol/L (ref 135–145)

## 2020-04-23 LAB — CBC
HCT: 42.7 % (ref 39.0–52.0)
Hemoglobin: 14.8 g/dL (ref 13.0–17.0)
MCH: 31.9 pg (ref 26.0–34.0)
MCHC: 34.7 g/dL (ref 30.0–36.0)
MCV: 92 fL (ref 80.0–100.0)
Platelets: 141 10*3/uL — ABNORMAL LOW (ref 150–400)
RBC: 4.64 MIL/uL (ref 4.22–5.81)
RDW: 11.8 % (ref 11.5–15.5)
WBC: 6.7 10*3/uL (ref 4.0–10.5)
nRBC: 0 % (ref 0.0–0.2)

## 2020-04-23 LAB — LIPID PANEL
Cholesterol: 151 mg/dL (ref 0–200)
HDL: 39 mg/dL — ABNORMAL LOW (ref >40)
LDL Cholesterol: 96 mg/dL (ref 0–99)
Total CHOL/HDL Ratio: 3.9 RATIO
Triglycerides: 81 mg/dL (ref <150)
VLDL: 16 mg/dL (ref 0–40)

## 2020-04-23 MED ORDER — ATORVASTATIN CALCIUM 10 MG PO TABS
20.0000 mg | ORAL_TABLET | Freq: Every day | ORAL | Status: DC
  Administered 2020-04-23 – 2020-04-24 (×2): 20 mg via ORAL
  Filled 2020-04-23 (×2): qty 2

## 2020-04-23 NOTE — Progress Notes (Addendum)
  Progress Note    04/23/2020 8:29 AM * No surgery found *  Subjective: no complaints   Vitals:   04/23/20 0346 04/23/20 0809  BP: 120/78 135/87  Pulse: 73 76  Resp: 19 16  Temp: 98 F (36.7 C) 98.6 F (37 C)  SpO2: 98% 98%   Physical Exam: Cardiac: regular rate and rhythm Lungs: non labored Extremities: well perfused and warm Neurologic: alert and oriented  CBC    Component Value Date/Time   WBC 6.7 04/23/2020 0200   RBC 4.64 04/23/2020 0200   HGB 14.8 04/23/2020 0200   HGB 14.9 04/10/2020 0854   HCT 42.7 04/23/2020 0200   HCT 44.2 04/10/2020 0854   PLT 141 (L) 04/23/2020 0200   PLT 184 04/10/2020 0854   MCV 92.0 04/23/2020 0200   MCV 93 04/10/2020 0854   MCH 31.9 04/23/2020 0200   MCHC 34.7 04/23/2020 0200   RDW 11.8 04/23/2020 0200   RDW 11.5 (L) 04/10/2020 0854   LYMPHSABS 1.0 09/02/2016 1514   MONOABS 0.6 09/02/2016 1514   EOSABS 0.1 09/02/2016 1514   BASOSABS 0.0 09/02/2016 1514    BMET    Component Value Date/Time   NA 138 04/23/2020 0200   NA 140 04/10/2020 0854   K 4.0 04/23/2020 0200   CL 106 04/23/2020 0200   CO2 23 04/23/2020 0200   GLUCOSE 99 04/23/2020 0200   BUN 27 (H) 04/23/2020 0200   BUN 17 04/10/2020 0854   CREATININE 1.20 04/23/2020 0200   CALCIUM 8.7 (L) 04/23/2020 0200   GFRNONAA >60 04/23/2020 0200   GFRAA 83 04/10/2020 0854    INR    Component Value Date/Time   INR 1.0 04/21/2020 1606     Intake/Output Summary (Last 24 hours) at 04/23/2020 0829 Last data filed at 04/22/2020 1350 Gross per 24 hour  Intake 240 ml  Output --  Net 240 ml     Assessment/Plan:  60 y.o. male  With a descending thoracic aortic aneurysm. Presently without symptoms. Blood pressure and HR well controlled. Cardiology started Toprol 25 mg daily. Will need to be NPO after midnight. Morning labs ordered. TEVAR and left carotid subclavian bypass tomorrow Monday 04/24/20  DVT prophylaxis:  Sq heparin   Graceann Congress, PA-C [facility] [phone] 04/23/2020 8:29 AM   VASCULAR STAFF ADDENDUM: I have independently interviewed and examined the patient. I agree with the above.  Reviewed risks / benefits / alternatives to LCCA-LSCA bypass and Zone 1 TEVAR with patient in detail this AM. I quoted him a small risk of stroke, small risk of chyle leak, small risk of phrenic nerve injury. We discussed the necessity for long term surveillance for his stent graft and the potential need for revision procedures.  He is understanding and wishes to proceed.  [name]. [name], MD [facility] Phone Number: [phone] 04/23/2020 9:08 AM

## 2020-04-23 NOTE — Progress Notes (Signed)
DAILY PROGRESS NOTE   Patient Name: Brett Cervantes Date of Encounter: 04/23/2020 Cardiologist: No primary care provider on file.  Chief Complaint   No chest pain  Patient Profile   60 yo male with recent evaluation for chest pain, thought to be atypical, sent for coronary CTA and found to have a large 7.8 cm descending thoracic aortic aneurysm. Cardiology is asked to evaluate for preoperative risk.  Subjective   No concerns overnight - started on BB yesterday. Tolerating. HR in the 60-70's. BP normal. Cholesterol is not bad - TC 151, TG 81, HDL 39, LDL 96. Goal LDL<70. Stable creatinine.  Objective   Vitals:   04/22/20 1920 04/22/20 2319 04/23/20 0346 04/23/20 0809  BP: 100/79 120/75 120/78 135/87  Pulse: 74 63 73 76  Resp: 20 19 19 16   Temp: 98.5 F (36.9 C) 98.2 F (36.8 C) 98 F (36.7 C) 98.6 F (37 C)  TempSrc: Oral Oral Oral Oral  SpO2: 97% 97% 98% 98%  Weight:      Height:        Intake/Output Summary (Last 24 hours) at 04/23/2020 1043 Last data filed at 04/22/2020 1350 Gross per 24 hour  Intake 240 ml  Output --  Net 240 ml   Filed Weights   04/21/20 1900  Weight: 80.1 kg    Physical Exam   General appearance: alert and no distress Neck: no carotid bruit, no JVD and thyroid not enlarged, symmetric, no tenderness/mass/nodules Lungs: clear to auscultation bilaterally Heart: regular rate and rhythm, S1, S2 normal, no murmur, click, rub or gallop Abdomen: soft, non-tender; bowel sounds normal; no masses,  no organomegaly Extremities: extremities normal, atraumatic, no cyanosis or edema Pulses: 2+ and symmetric Skin: Skin color, texture, turgor normal. No rashes or lesions Neurologic: Grossly normal Psych: Pleasant  Inpatient Medications    Scheduled Meds: . heparin  5,000 Units Subcutaneous Q8H  . metoprolol succinate  25 mg Oral Daily  . pantoprazole  40 mg Oral Daily  . potassium chloride  20-40 mEq Oral Once  . sodium chloride flush  3 mL  Intravenous Q12H    Continuous Infusions: . sodium chloride      PRN Meds: sodium chloride, acetaminophen **OR** acetaminophen, alum & mag hydroxide-simeth, guaiFENesin-dextromethorphan, hydrALAZINE, labetalol, metoprolol tartrate, morphine injection, ondansetron, oxyCODONE-acetaminophen, phenol, senna-docusate, sodium chloride flush   Labs   Results for orders placed or performed during the hospital encounter of 04/21/20 (from the past 48 hour(s))  CBC     Status: None   Collection Time: 04/21/20  2:05 PM  Result Value Ref Range   WBC 9.9 4.0 - 10.5 K/uL   RBC 4.37 4.22 - 5.81 MIL/uL   Hemoglobin 13.9 13.0 - 17.0 g/dL   HCT 04/23/20 40.9 - 81.1 %   MCV 94.3 80.0 - 100.0 fL   MCH 31.8 26.0 - 34.0 pg   MCHC 33.7 30.0 - 36.0 g/dL   RDW 91.4 78.2 - 95.6 %   Platelets 164 150 - 400 K/uL   nRBC 0.0 0.0 - 0.2 %    Comment: Performed at [facility], [address] [place], [place]  Basic metabolic panel     Status: Abnormal   Collection Time: 04/21/20  2:05 PM  Result Value Ref Range   Sodium 135 135 - 145 mmol/L   Potassium 4.0 3.5 - 5.1 mmol/L   Chloride 103 98 - 111 mmol/L   CO2 24 22 - 32 mmol/L   Glucose, Bld 97  70 - 99 mg/dL    Comment: Glucose reference range applies only to samples taken after fasting for at least 8 hours.   BUN 21 (H) 6 - 20 mg/dL   Creatinine, Ser 7.37 0.61 - 1.24 mg/dL   Calcium 8.6 (L) 8.9 - 10.3 mg/dL   GFR, Estimated >10 >62 mL/min    Comment: (NOTE) Calculated using the CKD-EPI Creatinine Equation (2021)    Anion gap 8 5 - 15    Comment: Performed at [facility], [address], [place], [place] [address]  Protime-INR     Status: None   Collection Time: 04/21/20  2:05 PM  Result Value Ref Range   Prothrombin Time 13.3 11.4 - 15.2 seconds   INR 1.1 0.8 - 1.2    Comment: (NOTE) INR goal varies based on device and disease states. Performed at [facility], [address], [place], [place] [address]   HIV Antibody  (routine testing w rflx)     Status: None   Collection Time: 04/21/20  4:06 PM  Result Value Ref Range   HIV Screen 4th Generation wRfx Non Reactive Non Reactive    Comment: Performed at [facility], [address] [place], [place] [address]  CBC     Status: None   Collection Time: 04/21/20  4:06 PM  Result Value Ref Range   WBC 8.8 4.0 - 10.5 K/uL   RBC 4.85 4.22 - 5.81 MIL/uL   Hemoglobin 15.0 13.0 - 17.0 g/dL   HCT 38.1 82.9 - 93.7 %   MCV 94.2 80.0 - 100.0 fL   MCH 30.9 26.0 - 34.0 pg   MCHC 32.8 30.0 - 36.0 g/dL   RDW 16.9 67.8 - 93.8 %   Platelets 161 150 - 400 K/uL   nRBC 0.0 0.0 - 0.2 %    Comment: Performed at [facility], [address], [place], [place] [address]  Protime-INR     Status: None   Collection Time: 04/21/20  4:06 PM  Result Value Ref Range   Prothrombin Time 12.8 11.4 - 15.2 seconds   INR 1.0 0.8 - 1.2    Comment: (NOTE) INR goal varies based on device and disease states. Performed at [facility], [address], [place], [place] [address]   SARS CORONAVIRUS 2 (TAT 6-24 HRS) Nasopharyngeal Nasopharyngeal Swab     Status: None   Collection Time: 04/21/20  4:17 PM   Specimen: Nasopharyngeal Swab  Result Value Ref Range   SARS Coronavirus 2 NEGATIVE NEGATIVE    Comment: (NOTE) SARS-CoV-2 target nucleic acids are NOT DETECTED.  The SARS-CoV-2 RNA is generally detectable in upper and lower respiratory specimens during the acute phase of infection. Negative results do not preclude SARS-CoV-2 infection, do not rule out co-infections with other pathogens, and should not be used as the sole basis for treatment or other patient management decisions. Negative results must be combined with clinical observations, patient history, and epidemiological information. The expected result is Negative.  Fact Sheet for Patients: HairSlick.no  Fact Sheet for Healthcare  Providers: quierodirigir.com  This test is not yet approved or cleared by the [place] FDA and  has been authorized for detection and/or diagnosis of SARS-CoV-2 by FDA under an Emergency Use Authorization (EUA). This EUA will remain  in effect (meaning this test can be used) for the duration of the COVID-19 declaration under Se ction 564(b)(1) of the Act, 21 U.S.C. section 360bbb-3(b)(1), unless the authorization  is terminated or revoked sooner.  Performed at [facility], [address] [place], [place] [address]   Urinalysis, Routine w reflex microscopic Nasopharyngeal Swab     Status: Abnormal   Collection Time: 04/21/20  4:19 PM  Result Value Ref Range   Color, Urine STRAW (A) YELLOW   APPearance CLEAR CLEAR   Specific Gravity, Urine 1.039 (H) 1.005 - 1.030   pH 6.0 5.0 - 8.0   Glucose, UA NEGATIVE NEGATIVE mg/dL   Hgb urine dipstick NEGATIVE NEGATIVE   Bilirubin Urine NEGATIVE NEGATIVE   Ketones, ur 5 (A) NEGATIVE mg/dL   Protein, ur NEGATIVE NEGATIVE mg/dL   Nitrite NEGATIVE NEGATIVE   Leukocytes,Ua NEGATIVE NEGATIVE    Comment: Performed at [facility], [address] [place], [place] [address]  Lipid panel     Status: Abnormal   Collection Time: 04/23/20  2:00 AM  Result Value Ref Range   Cholesterol 151 0 - 200 mg/dL   Triglycerides 81 <098 mg/dL   HDL 39 (L) >11 mg/dL   Total CHOL/HDL Ratio 3.9 RATIO   VLDL 16 0 - 40 mg/dL   LDL Cholesterol 96 0 - 99 mg/dL    Comment:        Total Cholesterol/HDL:CHD Risk Coronary Heart Disease Risk Table                     Men   Women  1/2 Average Risk   3.4   3.3  Average Risk       5.0   4.4  2 X Average Risk   9.6   7.1  3 X Average Risk  23.4   11.0        Use the calculated Patient Ratio above and the CHD Risk Table to determine the patient's CHD Risk.        ATP III CLASSIFICATION (LDL):  <100     mg/dL   Optimal  914-782  mg/dL   Near or Above                     Optimal  130-159  mg/dL   Borderline  956-213  mg/dL   High  >086     mg/dL   Very High Performed at [facility], [address], [place], [place] [address]   Basic metabolic panel     Status: Abnormal   Collection Time: 04/23/20  2:00 AM  Result Value Ref Range   Sodium 138 135 - 145 mmol/L   Potassium 4.0 3.5 - 5.1 mmol/L   Chloride 106 98 - 111 mmol/L   CO2 23 22 - 32 mmol/L   Glucose, Bld 99 70 - 99 mg/dL    Comment: Glucose reference range applies only to samples taken after fasting for at least 8 hours.   BUN 27 (H) 6 - 20 mg/dL   Creatinine, Ser 9.62 0.61 - 1.24 mg/dL   Calcium 8.7 (L) 8.9 - 10.3 mg/dL   GFR, Estimated >95 >28 mL/min    Comment: (NOTE) Calculated using the CKD-EPI Creatinine Equation (2021)    Anion gap 9 5 - 15    Comment: Performed at [facility], [address], [place], [place] [address]  CBC     Status: Abnormal   Collection Time: 04/23/20  2:00 AM  Result Value Ref Range   WBC 6.7 4.0 - 10.5 K/uL   RBC 4.64 4.22 - 5.81 MIL/uL  Hemoglobin 14.8 13.0 - 17.0 g/dL   HCT 16.142.7 09.639.0 - 04.552.0 %   MCV 92.0 80.0 - 100.0 fL   MCH 31.9 26.0 - 34.0 pg   MCHC 34.7 30.0 - 36.0 g/dL   RDW 40.911.8 81.111.5 - 91.415.5 %   Platelets 141 (L) 150 - 400 K/uL   nRBC 0.0 0.0 - 0.2 %    Comment: Performed at [facility], [address] [place], [place] [id number]    ECG   N/A  Telemetry   Sinus rhythm - Personally Reviewed  Radiology    CT Angio Chest/Abd/Pel for Dissection W and/or W/WO  Addendum Date: 04/22/2020   ADDENDUM REPORT: 04/22/2020 11:42 HISTORY: 60 yo male with chest pain/anginal equiv, [age]yr CHD risk 10-20%, not treadmill candidate EXAM: Cardiac/Coronary CTA TECHNIQUE: The patient was scanned on a [employer] Force scanner. PROTOCOL: A 120 kV prospective scan was triggered in the descending thoracic aorta at 111 HU's. Axial non-contrast 3 mm slices were carried out through the heart. The data set was analyzed on a dedicated work station and  scored using the Agatson method. Gantry rotation speed was 250 msecs and collimation was .6 mm. Beta blockade and 0.8 mg of sl NTG was given. The 3D data set was reconstructed in 5% intervals of the 67-82 % of the R-R cycle. Diastolic phases were analyzed on a dedicated work station using MPR, MIP and VRT modes. The patient received 95mL OMNIPAQUE IOHEXOL 350 MG/ML SOLN of contrast. FINDINGS: Quality: Excellent, HR 65 Coronary calcium score: The patient's coronary artery calcium score is 182, which places the patient in the 79th percentile. Coronary arteries: Normal coronary origins.  Right dominance. Right Coronary Artery: Dominant.  Normal vessel. Left Main Coronary Artery: Normal. Bifurcates into the LAD and LCx arteries. Left Anterior Descending Coronary Artery: Mild 25-49% mixed proximal LAD stenosis (CADRADS2). The remainder of the LAD demonstrates no significant disease. Left Circumflex Artery: AV groove vessel, no signficant disease. Single OM branch without disease. Aorta: Large descending thoracic aneurysm with intramural hematoma, measuring up to 7.8 cm, starting just distal to the left subclavian which takes off at a sharp angle and extending to the mid-thoracic aorta. Aortic atherosclerosis is noted. Aortic Valve: Trileaflet.  No calcifications. Other findings: Normal pulmonary vein drainage into the left atrium. Normal left atrial appendage without a thrombus. Normal size of the pulmonary artery. IMPRESSION: 1. No evidence of CAD, CADRADS = 0. 2. Coronary calcium score of 182. Calcium is located in the proximal LAD. This was 79th percentile for age and sex matched control. 3. Normal coronary origin with right dominance. 4. Large descending thoracic aortic aneursym with intramural hematoma arising just distal to the left subclavian and extending to the mid-thoracic level, measuring up to 7.8 cm in diameter. Vascular surgery is aware of this finding. 5. Aggressive secondary risk factor modification for  cardiovascular disease is recommended. Electronically Signed   By: Chrystie NoseKenneth C  M.D.   On: 04/22/2020 11:42   Result Date: 04/22/2020 CLINICAL DATA:  Anginal chest pain EXAM: CT ANGIOGRAPHY CHEST, ABDOMEN AND PELVIS TECHNIQUE: Non-contrast CT of the chest was initially obtained. Multidetector CT imaging through the chest, abdomen and pelvis was performed using the standard protocol during bolus administration of intravenous contrast. Multiplanar reconstructed images and MIPs were obtained and reviewed to evaluate the vascular anatomy. CONTRAST:  95mL OMNIPAQUE IOHEXOL 350 MG/ML SOLN COMPARISON:  None. FINDINGS: CTA CHEST FINDINGS Cardiovascular: Heart size within normal limits. Left anterior descending coronary artery calcifications are seen. Mediastinum/Nodes:  No enlarged mediastinal, hilar, or axillary lymph nodes. Thyroid gland, trachea, and esophagus demonstrate no significant findings. Lungs/Pleura: Lungs are clear. Musculoskeletal: No acute abnormality. There are is anterior wedging of multiple midthoracic vertebral bodies with exaggeration of thoracic kyphosis suspicious for Scheuermann's disease. Review of the MIP images confirms the above findings. CTA ABDOMEN AND PELVIS FINDINGS VASCULAR Aorta: The aortic valve is tricuspid. No significant dilatation of the aortic root or ascending thoracic aorta. Fusiform aneurysmal dilatation of the descending thoracic aorta with maximum diameter measuring 7.8 x 7.6 cm. Celiac: Patent without evidence of aneurysm, dissection, vasculitis or significant stenosis. SMA: Patent without evidence of aneurysm, dissection, vasculitis or significant stenosis. Renals: Both renal arteries are patent without evidence of aneurysm, dissection, vasculitis, fibromuscular dysplasia or significant stenosis. IMA: Patent without evidence of aneurysm, dissection, vasculitis or significant stenosis. Inflow: Patent without evidence of aneurysm, dissection, vasculitis or significant  stenosis. Veins: No obvious venous abnormality within the limitations of this arterial phase study. Review of the MIP images confirms the above findings. NON-VASCULAR Hepatobiliary: 8 mm hypodense lesion in the medial right hepatic lobe is too small to fully characterize given subcentimeter size. Liver and gallbladder otherwise unremarkable. No biliary ductal dilatation. Pancreas: Unremarkable. No pancreatic ductal dilatation or surrounding inflammatory changes. Spleen: Normal in size without focal abnormality. Adrenals/Urinary Tract: Adrenal glands are normal. 10 mm simple cyst present in the upper pole of the right kidney. Kidneys are otherwise normal. Bladder is normal. Stomach/Bowel: Descending and sigmoid colon diverticulosis without evidence of acute diverticulitis. Appendix is normal. No dilated loops of bowel to indicate ileus or obstruction. Lymphatic: No enlarged abdominal or pelvic lymph nodes. Reproductive: Prostate is moderately enlarged. Other: No abdominal wall hernia or abnormality. No abdominopelvic ascites. Musculoskeletal: Mild degenerative changes of the hips bilaterally. Review of the MIP images confirms the above findings. IMPRESSION: Aneurysmal dilatation of the descending thoracic aorta measuring up to 7.8 x 7.6 cm. These results were called by telephone at the time of interpretation on 04/21/2020 at 2:15 pm to provider Dr. Margarita Grizzle , who verbally acknowledged these results. Electronically Signed: By: Acquanetta Belling M.D. On: 04/21/2020 14:43    Cardiac Studies   See above  Assessment   Principal Problem:   Descending thoracic aortic aneurysm [facility]) Active Problems:   Coronary artery calcification seen on CT scan   Essential hypertension   Plan    1. Large descending thoracic aortic aneurysm with intramural hematoma. There is mild non-obstructive calcified CAD of the proximal LAD. Tolerating BB- recommend low dose aspirin 81 mg daily at discharge. Start atorvastatin 20 mg QHS  tonight.  Time Spent Directly with Patient:  I have spent a total of 25 minutes with the patient reviewing hospital notes, telemetry, EKGs, labs and examining the patient as well as establishing an assessment and plan that was discussed personally with the patient.  > 50% of time was spent in direct patient care.  Length of Stay:  LOS: 2 days   Chrystie Nose, MD, [facility], FACP  [place]  [facility]  Medical Director of the [facility] &  Cardiovascular Risk Reduction Clinic Diplomate of the American Board of Clinical Lipidology Attending Cardiologist  Direct Dial: [phone]  Fax: [phone]  Website:  [email or web address] 04/23/2020, 10:43 AM

## 2020-04-24 ENCOUNTER — Inpatient Hospital Stay (HOSPITAL_COMMUNITY): Payer: BC Managed Care – PPO | Admitting: Anesthesiology

## 2020-04-24 ENCOUNTER — Inpatient Hospital Stay (HOSPITAL_COMMUNITY): Payer: BC Managed Care – PPO

## 2020-04-24 ENCOUNTER — Inpatient Hospital Stay (HOSPITAL_COMMUNITY)
Admission: RE | Admit: 2020-04-24 | Payer: BC Managed Care – PPO | Source: Home / Self Care | Admitting: Vascular Surgery

## 2020-04-24 ENCOUNTER — Encounter (HOSPITAL_COMMUNITY)
Admission: EM | Disposition: A | Discharge status: Home / Self Care | Payer: Self-pay | Source: Home / Self Care | Attending: Vascular Surgery

## 2020-04-24 HISTORY — PX: ANEURYSM COILING: SHX5349

## 2020-04-24 HISTORY — PX: CAROTID-SUBCLAVIAN BYPASS GRAFT: SHX910

## 2020-04-24 HISTORY — PX: ULTRASOUND GUIDANCE FOR VASCULAR ACCESS: SHX6516

## 2020-04-24 HISTORY — PX: THORACIC AORTIC ENDOVASCULAR STENT GRAFT: SHX6112

## 2020-04-24 LAB — CBC
HCT: 44.6 % (ref 39.0–52.0)
HCT: 39.5 % (ref 39.0–52.0)
Hemoglobin: 15.1 g/dL (ref 13.0–17.0)
Hemoglobin: 13.6 g/dL (ref 13.0–17.0)
MCH: 31.5 pg (ref 26.0–34.0)
MCH: 31.9 pg (ref 26.0–34.0)
MCHC: 33.9 g/dL (ref 30.0–36.0)
MCHC: 34.4 g/dL (ref 30.0–36.0)
MCV: 93.1 fL (ref 80.0–100.0)
MCV: 92.7 fL (ref 80.0–100.0)
Platelets: 140 10*3/uL — ABNORMAL LOW (ref 150–400)
Platelets: 133 10*3/uL — ABNORMAL LOW (ref 150–400)
RBC: 4.79 MIL/uL (ref 4.22–5.81)
RBC: 4.26 MIL/uL (ref 4.22–5.81)
RDW: 11.6 % (ref 11.5–15.5)
RDW: 11.7 % (ref 11.5–15.5)
WBC: 7.5 10*3/uL (ref 4.0–10.5)
WBC: 10.4 10*3/uL (ref 4.0–10.5)
nRBC: 0 % (ref 0.0–0.2)
nRBC: 0 % (ref 0.0–0.2)

## 2020-04-24 LAB — BASIC METABOLIC PANEL
Anion gap: 10 (ref 5–15)
Anion gap: 11 (ref 5–15)
BUN: 30 mg/dL — ABNORMAL HIGH (ref 6–20)
BUN: 24 mg/dL — ABNORMAL HIGH (ref 6–20)
CO2: 23 mmol/L (ref 22–32)
CO2: 22 mmol/L (ref 22–32)
Calcium: 8.7 mg/dL — ABNORMAL LOW (ref 8.9–10.3)
Calcium: 8.3 mg/dL — ABNORMAL LOW (ref 8.9–10.3)
Chloride: 105 mmol/L (ref 98–111)
Chloride: 105 mmol/L (ref 98–111)
Creatinine, Ser: 1.18 mg/dL (ref 0.61–1.24)
Creatinine, Ser: 1.23 mg/dL (ref 0.61–1.24)
GFR, Estimated: >60 mL/min (ref >60)
GFR, Estimated: >60 mL/min (ref >60)
Glucose, Bld: 99 mg/dL (ref 70–99)
Glucose, Bld: 199 mg/dL — ABNORMAL HIGH (ref 70–99)
Potassium: 4 mmol/L (ref 3.5–5.1)
Potassium: 4.1 mmol/L (ref 3.5–5.1)
Sodium: 138 mmol/L (ref 135–145)
Sodium: 138 mmol/L (ref 135–145)

## 2020-04-24 LAB — POCT ACTIVATED CLOTTING TIME
Activated Clotting Time: 225 seconds
Activated Clotting Time: 243 seconds
Activated Clotting Time: 243 seconds
Activated Clotting Time: 243 seconds
Activated Clotting Time: 249 seconds

## 2020-04-24 LAB — SURGICAL PCR SCREEN
MRSA, PCR: NEGATIVE
Staphylococcus aureus: POSITIVE — AB

## 2020-04-24 LAB — PREPARE RBC (CROSSMATCH)

## 2020-04-24 LAB — MAGNESIUM: Magnesium: 1.8 mg/dL (ref 1.7–2.4)

## 2020-04-24 LAB — ABO/RH: ABO/RH(D): B POS

## 2020-04-24 LAB — PROTIME-INR
INR: 1.1 (ref 0.8–1.2)
Prothrombin Time: 14.1 seconds (ref 11.4–15.2)

## 2020-04-24 LAB — APTT: aPTT: 24 seconds (ref 24–36)

## 2020-04-24 SURGERY — CREATION, BYPASS, ARTERIAL, SUBCLAVIAN TO CAROTID, USING GRAFT
Anesthesia: General | Site: Neck

## 2020-04-24 MED ORDER — CEFAZOLIN SODIUM-DEXTROSE 2-3 GM-%(50ML) IV SOLR
INTRAVENOUS | Status: DC | PRN
  Administered 2020-04-24: 2 g via INTRAVENOUS

## 2020-04-24 MED ORDER — CEFAZOLIN SODIUM-DEXTROSE 2-4 GM/100ML-% IV SOLN
2.0000 g | Freq: Three times a day (TID) | INTRAVENOUS | Status: AC
  Administered 2020-04-24 – 2020-04-25 (×2): 2 g via INTRAVENOUS
  Filled 2020-04-24 (×2): qty 100

## 2020-04-24 MED ORDER — ONDANSETRON HCL 4 MG/2ML IJ SOLN
INTRAMUSCULAR | Status: DC | PRN
  Administered 2020-04-24: 4 mg via INTRAVENOUS

## 2020-04-24 MED ORDER — SODIUM CHLORIDE 0.9 % IV SOLN: INTRAVENOUS | Status: DC | PRN

## 2020-04-24 MED ORDER — HYDROMORPHONE HCL 1 MG/ML IJ SOLN
0.5000 mg | INTRAMUSCULAR | Status: DC | PRN
Start: 2020-04-24 — End: 2020-04-25

## 2020-04-24 MED ORDER — POTASSIUM CHLORIDE CRYS ER 20 MEQ PO TBCR: 20.0000 meq | EXTENDED_RELEASE_TABLET | Freq: Every day | ORAL | Status: DC | PRN

## 2020-04-24 MED ORDER — OXYCODONE HCL 5 MG/5ML PO SOLN: 5.0000 mg | Freq: Once | ORAL | Status: DC | PRN

## 2020-04-24 MED ORDER — PHENYLEPHRINE 40 MCG/ML (10ML) SYRINGE FOR IV PUSH (FOR BLOOD PRESSURE SUPPORT)
PREFILLED_SYRINGE | INTRAVENOUS | Status: DC | PRN
  Administered 2020-04-24: 160 ug via INTRAVENOUS

## 2020-04-24 MED ORDER — LACTATED RINGERS IV SOLN: INTRAVENOUS | Status: DC | PRN

## 2020-04-24 MED ORDER — PROPOFOL 10 MG/ML IV BOLUS
INTRAVENOUS | Status: DC | PRN
  Administered 2020-04-24: 200 mg via INTRAVENOUS

## 2020-04-24 MED ORDER — FENTANYL CITRATE (PF) 250 MCG/5ML IJ SOLN
INTRAMUSCULAR | Status: DC | PRN
  Administered 2020-04-24: 25 ug via INTRAVENOUS
  Administered 2020-04-24: 150 ug via INTRAVENOUS
  Administered 2020-04-24: 25 ug via INTRAVENOUS
  Administered 2020-04-24: 50 ug via INTRAVENOUS

## 2020-04-24 MED ORDER — PROTAMINE SULFATE 10 MG/ML IV SOLN
INTRAVENOUS | Status: DC | PRN
  Administered 2020-04-24: 40 mg via INTRAVENOUS
  Administered 2020-04-24: 10 mg via INTRAVENOUS

## 2020-04-24 MED ORDER — LIDOCAINE 2% (20 MG/ML) 5 ML SYRINGE
INTRAMUSCULAR | Status: DC | PRN
  Administered 2020-04-24: 50 mg via INTRAVENOUS

## 2020-04-24 MED ORDER — PHENYLEPHRINE HCL-NACL 10-0.9 MG/250ML-% IV SOLN
INTRAVENOUS | Status: DC | PRN
  Administered 2020-04-24: 20 ug/min via INTRAVENOUS

## 2020-04-24 MED ORDER — PROPOFOL 10 MG/ML IV BOLUS
INTRAVENOUS | Status: AC
  Filled 2020-04-24: qty 20

## 2020-04-24 MED ORDER — OXYCODONE HCL 5 MG PO TABS: 5.0000 mg | ORAL_TABLET | Freq: Once | ORAL | Status: DC | PRN

## 2020-04-24 MED ORDER — MUPIROCIN 2 % EX OINT
1.0000 "application " | TOPICAL_OINTMENT | Freq: Two times a day (BID) | CUTANEOUS | Status: DC
  Administered 2020-04-24 – 2020-04-25 (×2): 1 via NASAL
  Filled 2020-04-24 (×2): qty 22

## 2020-04-24 MED ORDER — HEPARIN SODIUM (PORCINE) 5000 UNIT/ML IJ SOLN
5000.0000 [IU] | Freq: Three times a day (TID) | INTRAMUSCULAR | Status: DC
  Administered 2020-04-25: 5000 [IU] via SUBCUTANEOUS
  Filled 2020-04-24: qty 1

## 2020-04-24 MED ORDER — ONDANSETRON HCL 4 MG/2ML IJ SOLN: 4.0000 mg | Freq: Four times a day (QID) | INTRAMUSCULAR | Status: DC | PRN

## 2020-04-24 MED ORDER — HEPARIN SODIUM (PORCINE) 1000 UNIT/ML IJ SOLN
INTRAMUSCULAR | Status: DC | PRN
  Administered 2020-04-24 (×2): 1000 [IU] via INTRAVENOUS
  Administered 2020-04-24: 2000 [IU] via INTRAVENOUS
  Administered 2020-04-24: 1000 [IU] via INTRAVENOUS
  Administered 2020-04-24: 8000 [IU] via INTRAVENOUS

## 2020-04-24 MED ORDER — HEPARIN SODIUM (PORCINE) 1000 UNIT/ML IJ SOLN
INTRAMUSCULAR | Status: AC
  Filled 2020-04-24: qty 1

## 2020-04-24 MED ORDER — SODIUM CHLORIDE 0.9 % IV SOLN
INTRAVENOUS | Status: AC
  Filled 2020-04-24: qty 1.2

## 2020-04-24 MED ORDER — ONDANSETRON HCL 4 MG/2ML IJ SOLN
INTRAMUSCULAR | Status: AC
  Filled 2020-04-24: qty 2

## 2020-04-24 MED ORDER — DEXAMETHASONE SODIUM PHOSPHATE 10 MG/ML IJ SOLN
INTRAMUSCULAR | Status: AC
  Filled 2020-04-24: qty 1

## 2020-04-24 MED ORDER — LACTATED RINGERS IV SOLN: INTRAVENOUS | Status: DC

## 2020-04-24 MED ORDER — ORAL CARE MOUTH RINSE: 15.0000 mL | Freq: Once | OROMUCOSAL | Status: AC

## 2020-04-24 MED ORDER — 0.9 % SODIUM CHLORIDE (POUR BTL) OPTIME
TOPICAL | Status: DC | PRN
  Administered 2020-04-24: 2000 mL

## 2020-04-24 MED ORDER — CHLORHEXIDINE GLUCONATE 0.12 % MT SOLN
15.0000 mL | Freq: Once | OROMUCOSAL | Status: AC
  Administered 2020-04-24: 15 mL via OROMUCOSAL
  Filled 2020-04-24: qty 15

## 2020-04-24 MED ORDER — SUGAMMADEX SODIUM 200 MG/2ML IV SOLN
INTRAVENOUS | Status: DC | PRN
  Administered 2020-04-24: 200 mg via INTRAVENOUS

## 2020-04-24 MED ORDER — HEMOSTATIC AGENTS (NO CHARGE) OPTIME
TOPICAL | Status: DC | PRN
  Administered 2020-04-24: 1 via TOPICAL

## 2020-04-24 MED ORDER — FENTANYL CITRATE (PF) 100 MCG/2ML IJ SOLN
25.0000 ug | INTRAMUSCULAR | Status: DC | PRN
Start: 2020-04-24 — End: 2020-04-24

## 2020-04-24 MED ORDER — SODIUM CHLORIDE 0.9 % IV SOLN: 500.0000 mL | Freq: Once | INTRAVENOUS | Status: DC | PRN

## 2020-04-24 MED ORDER — EPHEDRINE SULFATE-NACL 50-0.9 MG/10ML-% IV SOSY
PREFILLED_SYRINGE | INTRAVENOUS | Status: DC | PRN
  Administered 2020-04-24 (×6): 5 mg via INTRAVENOUS

## 2020-04-24 MED ORDER — MIDAZOLAM HCL 2 MG/2ML IJ SOLN
INTRAMUSCULAR | Status: AC
  Filled 2020-04-24: qty 2

## 2020-04-24 MED ORDER — EPHEDRINE 5 MG/ML INJ
INTRAVENOUS | Status: AC
  Filled 2020-04-24: qty 10

## 2020-04-24 MED ORDER — LIDOCAINE 2% (20 MG/ML) 5 ML SYRINGE
INTRAMUSCULAR | Status: AC
  Filled 2020-04-24: qty 5

## 2020-04-24 MED ORDER — SODIUM CHLORIDE 0.9 % IV SOLN: INTRAVENOUS | Status: DC

## 2020-04-24 MED ORDER — LIDOCAINE HCL (PF) 1 % IJ SOLN
INTRAMUSCULAR | Status: AC
  Filled 2020-04-24: qty 5

## 2020-04-24 MED ORDER — IODIXANOL 320 MG/ML IV SOLN
INTRAVENOUS | Status: DC | PRN
  Administered 2020-04-24: 111 mL

## 2020-04-24 MED ORDER — CHLORHEXIDINE GLUCONATE CLOTH 2 % EX PADS
6.0000 | MEDICATED_PAD | Freq: Every day | CUTANEOUS | Status: DC
  Administered 2020-04-24: 6 via TOPICAL

## 2020-04-24 MED ORDER — DOCUSATE SODIUM 100 MG PO CAPS
100.0000 mg | ORAL_CAPSULE | Freq: Every day | ORAL | Status: DC
  Administered 2020-04-25: 100 mg via ORAL
  Filled 2020-04-24: qty 1

## 2020-04-24 MED ORDER — ROCURONIUM BROMIDE 10 MG/ML (PF) SYRINGE
PREFILLED_SYRINGE | INTRAVENOUS | Status: AC
  Filled 2020-04-24: qty 10

## 2020-04-24 MED ORDER — MAGNESIUM SULFATE 2 GM/50ML IV SOLN: 2.0000 g | Freq: Every day | INTRAVENOUS | Status: DC | PRN

## 2020-04-24 MED ORDER — DEXAMETHASONE SODIUM PHOSPHATE 10 MG/ML IJ SOLN
INTRAMUSCULAR | Status: DC | PRN
  Administered 2020-04-24: 10 mg via INTRAVENOUS

## 2020-04-24 MED ORDER — ASPIRIN EC 81 MG PO TBEC
81.0000 mg | DELAYED_RELEASE_TABLET | Freq: Every day | ORAL | Status: DC
  Administered 2020-04-25: 81 mg via ORAL
  Filled 2020-04-24: qty 1

## 2020-04-24 MED ORDER — MIDAZOLAM HCL 2 MG/2ML IJ SOLN
INTRAMUSCULAR | Status: DC | PRN
  Administered 2020-04-24: 2 mg via INTRAVENOUS

## 2020-04-24 MED ORDER — FENTANYL CITRATE (PF) 250 MCG/5ML IJ SOLN
INTRAMUSCULAR | Status: AC
  Filled 2020-04-24: qty 5

## 2020-04-24 MED ORDER — PHENYLEPHRINE 40 MCG/ML (10ML) SYRINGE FOR IV PUSH (FOR BLOOD PRESSURE SUPPORT)
PREFILLED_SYRINGE | INTRAVENOUS | Status: AC
  Filled 2020-04-24: qty 10

## 2020-04-24 MED ORDER — ROCURONIUM BROMIDE 10 MG/ML (PF) SYRINGE
PREFILLED_SYRINGE | INTRAVENOUS | Status: DC | PRN
  Administered 2020-04-24: 20 mg via INTRAVENOUS
  Administered 2020-04-24: 60 mg via INTRAVENOUS
  Administered 2020-04-24 (×3): 20 mg via INTRAVENOUS

## 2020-04-24 SURGICAL SUPPLY — 113 items
BAG BANDED W/RUBBER/TAPE 36X54 (MISCELLANEOUS) ×4 IMPLANT
BAG SNAP BAND KOVER 36X36 (MISCELLANEOUS) ×4 IMPLANT
BENZOIN TINCTURE PRP APPL 2/3 (GAUZE/BANDAGES/DRESSINGS) ×12 IMPLANT
CANISTER SUCT 3000ML PPV (MISCELLANEOUS) ×4 IMPLANT
CANNULA VESSEL 3MM 2 BLNT TIP (CANNULA) ×4 IMPLANT
CATH ACCU-VU SIZ PIG 5F 100CM (CATHETERS) ×4 IMPLANT
CATH ANGIO 5F BER2 100CM (CATHETERS) ×4 IMPLANT
CATH ANGIO 5F BER2 65CM (CATHETERS) ×4 IMPLANT
CATH ROBINSON RED A/P 18FR (CATHETERS) ×4 IMPLANT
CATH VISIONS PV .035 IVUS (CATHETERS) ×4 IMPLANT
CHLORAPREP W/TINT 26 (MISCELLANEOUS) ×4 IMPLANT
CLIP VESOCCLUDE MED 24/CT (CLIP) ×4 IMPLANT
CLIP VESOCCLUDE MED 6/CT (CLIP) ×4 IMPLANT
CLIP VESOCCLUDE SM WIDE 24/CT (CLIP) ×4 IMPLANT
CLIP VESOCCLUDE SM WIDE 6/CT (CLIP) ×4 IMPLANT
CLSR STERI-STRIP ANTIMIC 1/2X4 (GAUZE/BANDAGES/DRESSINGS) ×12 IMPLANT
COIL NESTER 14X12 (Embolic) ×20 IMPLANT
COVER DOME SNAP 22 D (MISCELLANEOUS) ×4 IMPLANT
COVER MAYO STAND STRL (DRAPES) ×4 IMPLANT
COVER PROBE W GEL 5X96 (DRAPES) ×8 IMPLANT
COVER WAND RF STERILE (DRAPES) ×4 IMPLANT
DERMABOND ADVANCED (GAUZE/BANDAGES/DRESSINGS) ×1
DERMABOND ADVANCED .7 DNX12 (GAUZE/BANDAGES/DRESSINGS) ×3 IMPLANT
DEVICE CLOSURE MYNXGRIP 5F (Vascular Products) ×4 IMPLANT
DEVICE CLOSURE PERCLS PRGLD 6F (VASCULAR PRODUCTS) ×6 IMPLANT
DRAIN CHANNEL 15F RND FF W/TCR (WOUND CARE) IMPLANT
DRAPE BRACHIAL (DRAPES) ×4 IMPLANT
DRAPE HALF SHEET 40X57 (DRAPES) ×4 IMPLANT
DRAPE INCISE IOBAN 66X45 STRL (DRAPES) ×4 IMPLANT
DRSG TEGADERM 2-3/8X2-3/4 SM (GAUZE/BANDAGES/DRESSINGS) ×4 IMPLANT
DRYSEAL FLEXSHEATH 22FR 33CM (SHEATH) ×1
ELECT CAUTERY BLADE 6.4 (BLADE) ×4 IMPLANT
ELECT REM PT RETURN 9FT ADLT (ELECTROSURGICAL) ×8
ELECTRODE REM PT RTRN 9FT ADLT (ELECTROSURGICAL) ×6 IMPLANT
EVACUATOR SILICONE 100CC (DRAIN) IMPLANT
GAUZE 4X4 16PLY RFD (DISPOSABLE) ×4 IMPLANT
GAUZE SPONGE 4X4 12PLY STRL (GAUZE/BANDAGES/DRESSINGS) ×4 IMPLANT
GLIDESHEATH SLEND SS 6F .021 (SHEATH) ×4 IMPLANT
GLIDEWIRE ADV .035X260CM (WIRE) ×4 IMPLANT
GLOVE BIOGEL PI IND STRL 6 (GLOVE) ×3 IMPLANT
GLOVE BIOGEL PI INDICATOR 6 (GLOVE) ×1
GLOVE SRG 8 PF TXTR STRL LF DI (GLOVE) ×3 IMPLANT
GLOVE SURG LTX SZ6.5 (GLOVE) ×8 IMPLANT
GLOVE SURG PR MICRO ENCORE 7.5 (GLOVE) ×4 IMPLANT
GLOVE SURG SS PI 8.0 STRL IVOR (GLOVE) ×4 IMPLANT
GLOVE SURG UNDER POLY LF SZ6.5 (GLOVE) ×16 IMPLANT
GLOVE SURG UNDER POLY LF SZ7 (GLOVE) ×12 IMPLANT
GLOVE SURG UNDER POLY LF SZ7.5 (GLOVE) ×4 IMPLANT
GLOVE SURG UNDER POLY LF SZ8 (GLOVE) ×4
GOWN STRL REUS W/ TWL LRG LVL3 (GOWN DISPOSABLE) ×6 IMPLANT
GOWN STRL REUS W/ TWL XL LVL3 (GOWN DISPOSABLE) ×3 IMPLANT
GOWN STRL REUS W/TWL LRG LVL3 (GOWN DISPOSABLE) ×8
GOWN STRL REUS W/TWL XL LVL3 (GOWN DISPOSABLE) ×4
GRAFT BALLN CATH 65CM (STENTS) IMPLANT
GRAFT HEMASHIELD 8MM (Vascular Products) ×4 IMPLANT
GRAFT VASC STRG 30X8KNIT (Vascular Products) ×3 IMPLANT
HEMOSTAT SNOW SURGICEL 2X4 (HEMOSTASIS) ×4 IMPLANT
INSERT FOGARTY SM (MISCELLANEOUS) ×4 IMPLANT
IV ADAPTER SYR DOUBLE MALE LL (MISCELLANEOUS) ×4 IMPLANT
KIT BASIN OR (CUSTOM PROCEDURE TRAY) ×4 IMPLANT
KIT MICROPUNCTURE NIT STIFF (SHEATH) ×8 IMPLANT
KIT SHUNT ARGYLE CAROTID ART 6 (VASCULAR PRODUCTS) IMPLANT
KIT TURNOVER KIT B (KITS) ×4 IMPLANT
LOOP VESSEL MAXI BLUE (MISCELLANEOUS) ×8 IMPLANT
NEEDLE HYPO 25GX1X1/2 BEV (NEEDLE) IMPLANT
NEEDLE PERC 18GX7CM (NEEDLE) ×4 IMPLANT
NS IRRIG 1000ML POUR BTL (IV SOLUTION) ×12 IMPLANT
PACK CAROTID (CUSTOM PROCEDURE TRAY) ×4 IMPLANT
PACK ENDOVASCULAR (PACKS) ×4 IMPLANT
PAD ARMBOARD 7.5X6 YLW CONV (MISCELLANEOUS) ×8 IMPLANT
PENCIL BUTTON HOLSTER BLD 10FT (ELECTRODE) ×4 IMPLANT
PERCLOSE PROGLIDE 6F (VASCULAR PRODUCTS) ×8
POSITIONER HEAD DONUT 9IN (MISCELLANEOUS) ×4 IMPLANT
PROTECTION STATION PRESSURIZED (MISCELLANEOUS) ×4
PUNCH AORTIC ROTATE 4.0MM (MISCELLANEOUS) ×4 IMPLANT
SHEATH AVANTI 11CM 8FR (SHEATH) IMPLANT
SHEATH DRYSEAL FLEX 22FR 33CM (SHEATH) ×3 IMPLANT
SHEATH PINNACLE 5F 10CM (SHEATH) ×4 IMPLANT
SHEATH PINNACLE 8F 10CM (SHEATH) ×4 IMPLANT
SHIELD RADPAD SCOOP 12X17 (MISCELLANEOUS) ×8 IMPLANT
SLEEVE ISOL F/PACE RF HD COVER (MISCELLANEOUS) ×4 IMPLANT
SPONGE SURGIFOAM ABS GEL 100 (HEMOSTASIS) IMPLANT
STATION PROTECTION PRESSURIZED (MISCELLANEOUS) ×3 IMPLANT
STENT GRAFT BALLN CATH 65CM (STENTS)
STENT GRFT THORAC ACS 31X31X20 (Endovascular Graft) ×4 IMPLANT
STENT GRFT THORAC ACS 37X37X15 (Endovascular Graft) ×4 IMPLANT
STOPCOCK MORSE 400PSI 3WAY (MISCELLANEOUS) ×8 IMPLANT
STRIP CLOSURE SKIN 1/2X4 (GAUZE/BANDAGES/DRESSINGS) ×4 IMPLANT
SUT ETHILON 3 0 PS 1 (SUTURE) IMPLANT
SUT MNCRL AB 4-0 PS2 18 (SUTURE) IMPLANT
SUT PROLENE 5 0 C 1 24 (SUTURE) IMPLANT
SUT PROLENE 6 0 BV (SUTURE) ×12 IMPLANT
SUT PROLENE 7 0 BV 1 (SUTURE) IMPLANT
SUT SILK 3 0 (SUTURE)
SUT SILK 3-0 18XBRD TIE 12 (SUTURE) IMPLANT
SUT VIC AB 2-0 CT1 27 (SUTURE)
SUT VIC AB 2-0 CT1 TAPERPNT 27 (SUTURE) IMPLANT
SUT VIC AB 3-0 SH 27 (SUTURE) ×8
SUT VIC AB 3-0 SH 27X BRD (SUTURE) ×6 IMPLANT
SYR 10ML LL (SYRINGE) ×12 IMPLANT
SYR 20CC LL (SYRINGE) ×8 IMPLANT
SYR 30ML LL (SYRINGE) IMPLANT
SYR 50ML LL SCALE MARK (SYRINGE) ×4 IMPLANT
SYR CONTROL 10ML LL (SYRINGE) IMPLANT
SYR MEDRAD MARK V 150ML (SYRINGE) ×4 IMPLANT
TOWEL GREEN STERILE (TOWEL DISPOSABLE) ×4 IMPLANT
TRAY FOLEY MTR SLVR 16FR STAT (SET/KITS/TRAYS/PACK) ×4 IMPLANT
TUBING HIGH PRESSURE 120CM (CONNECTOR) ×4 IMPLANT
TUBING INJECTOR 48 (MISCELLANEOUS) ×4 IMPLANT
WATER STERILE IRR 1000ML POUR (IV SOLUTION) ×4 IMPLANT
WIRE BENTSON .035X145CM (WIRE) IMPLANT
WIRE STARTER BENTSON 035X150 (WIRE) ×4 IMPLANT
WIRE STIFF LUNDERQUIST 260CM (WIRE) ×4 IMPLANT

## 2020-04-24 NOTE — Consult Note (Signed)
   ASSESSMENT & PLAN:  Brett Cervantes is a 60 y.o. male with 51mm DTAA originating just distal to left subclavian artery. Plan left carotid-subclavian bypass, Zone 2 TEVAR, Coil embolization of LSCA stump today in OR. To ICU post procedure.  SUBJECTIVE:  No complaints.  OBJECTIVE:  BP 127/86 (BP Location: Right Arm)   Pulse 75   Temp 98.1 F (36.7 C) (Oral)   Resp 18   Ht 5\' 10"  (1.778 m)   Wt 80.1 kg   SpO2 99%   BMI 25.34 kg/m   Intake/Output Summary (Last 24 hours) at 04/24/2020 0715 Last data filed at 04/23/2020 2031 Gross per 24 hour  Intake 243 ml  Output --  Net 243 ml    NAD RRR unlabored 2+ radial pulses 2+ femoral pulses  CBC Latest Ref Rng & Units 04/24/2020 04/23/2020 04/21/2020  WBC 4.0 - 10.5 K/uL 7.5 6.7 8.8  Hemoglobin 13.0 - 17.0 g/dL 04/23/2020 20.2 54.2  Hematocrit 39.0 - 52.0 % 44.6 42.7 45.7  Platelets 150 - 400 K/uL 140(L) 141(L) 161     CMP Latest Ref Rng & Units 04/24/2020 04/23/2020 04/21/2020  Glucose 70 - 99 mg/dL 99 99 97  BUN 6 - 20 mg/dL 04/23/2020) 23(J) 62(G)  Creatinine 0.61 - 1.24 mg/dL 31(D 1.76 1.60  Sodium 135 - 145 mmol/L 138 138 135  Potassium 3.5 - 5.1 mmol/L 4.0 4.0 4.0  Chloride 98 - 111 mmol/L 105 106 103  CO2 22 - 32 mmol/L 23 23 24   Calcium 8.9 - 10.3 mg/dL 7.37) ) 1.0(G)  Total Protein 6.5 - 8.1 g/dL - - -  Total Bilirubin 0.3 - 1.2 mg/dL - - -  Alkaline Phos 38 - 126 U/L - - -  AST 15 - 41 U/L - - -  ALT 17 - 63 U/L - - -    Estimated Creatinine Clearance: 69.6 mL/min (by C-G formula based on SCr of 1.18 mg/dL).  2.6(R. 4.8(N, MD [facility] Phone Number: [phone] 04/24/2020 7:15 AM

## 2020-04-24 NOTE — Anesthesia Procedure Notes (Signed)
Arterial Line Insertion Start/End2/21/2022 7:00 AM, 04/24/2020 7:05 AM Performed by: Carlos American, CRNA, CRNA  Patient location: Pre-op. Preanesthetic checklist: patient identified, IV checked, site marked, risks and benefits discussed, surgical consent, monitors and equipment checked, pre-op evaluation, timeout performed and anesthesia consent Lidocaine 1% used for infiltration Right, radial was placed Catheter size: 20 Fr Hand hygiene performed  and maximum sterile barriers used   Attempts: 1 Procedure performed without using ultrasound guided technique. Following insertion, dressing applied and Biopatch. Post procedure assessment: normal and unchanged

## 2020-04-24 NOTE — Transfer of Care (Signed)
Immediate Anesthesia Transfer of Care Note  Patient: Brett Cervantes  Procedure(s) Performed: LEFT CAROTID-SUBCLAVIAN BYPASS GRAFT USING 109mm HEMASHIELD GRAFT (Left Neck) THORACIC AORTIC ENDOVASCULAR STENT GRAFT (N/A ) ULTRASOUND GUIDANCE FOR VASCULAR ACCESS (N/A ) LEFT SUBCLAVIAN ARTERY COILING (Left )  Patient Location: PACU  Anesthesia Type:General  Level of Consciousness: awake, alert  and patient cooperative  Airway & Oxygen Therapy: Patient Spontanous Breathing and Patient connected to face mask oxygen  Post-op Assessment: Report given to RN and Post -op Vital signs reviewed and stable  Post vital signs: Reviewed and stable  Last Vitals:  Vitals Value Taken Time  BP 130/80 04/24/20 1259  Temp    Pulse 114 04/24/20 1301  Resp 15 04/24/20 1301  SpO2 100 % 04/24/20 1301  Vitals shown include unvalidated device data.  Last Pain:  Vitals:   04/24/20 0342  TempSrc: Oral  PainSc:          Complications: No complications documented.

## 2020-04-24 NOTE — Anesthesia Preprocedure Evaluation (Signed)
Anesthesia Evaluation  Patient identified by MRN, date of birth, ID band Patient awake    Reviewed: Allergy & Precautions, H&P , NPO status , Patient's Chart, lab work & pertinent test results  Airway Mallampati: II   Neck ROM: full    Dental   Pulmonary neg pulmonary ROS,    breath sounds clear to auscultation       Cardiovascular hypertension, + Peripheral Vascular Disease   Rhythm:regular Rate:Normal  Thoracic aortic aneurysm   Neuro/Psych    GI/Hepatic   Endo/Other    Renal/GU      Musculoskeletal   Abdominal   Peds  Hematology   Anesthesia Other Findings   Reproductive/Obstetrics                             Anesthesia Physical Anesthesia Plan  ASA: III  Anesthesia Plan: General   Post-op Pain Management:    Induction: Intravenous  PONV Risk Score and Plan: 2 and Ondansetron, Dexamethasone, Midazolam and Treatment may vary due to age or medical condition  Airway Management Planned: Oral ETT  Additional Equipment: Arterial line  Intra-op Plan:   Post-operative Plan: Extubation in OR  Informed Consent: I have reviewed the patients History and Physical, chart, labs and discussed the procedure including the risks, benefits and alternatives for the proposed anesthesia with the patient or authorized representative who has indicated his/her understanding and acceptance.     Dental advisory given  Plan Discussed with: CRNA, Anesthesiologist and Surgeon  Anesthesia Plan Comments:         Anesthesia Quick Evaluation

## 2020-04-24 NOTE — Progress Notes (Signed)
Called to OR 16 for TR band application to L radail area. Pre 6 fr sheath removal - left hand noted pink, with cap refill of less than 3 seconds; pt. Intubated and sedated so pain and paresthesia was deferred as a result.  6 fr sheath was removed with cath tip intact; hemostasis was easily achieved with 12 cc of air placed in the TR band; at the conclusion of TR band application, l hand was warm, with cap refill less than 3 seconds; l radial arteriotomy was stable, no bleeding, no hematoma was present; Care was transferred to OR staff with deflation syringe in their possession, pt. l hand was stable.

## 2020-04-24 NOTE — Anesthesia Procedure Notes (Signed)
Procedure Name: Intubation Date/Time: 04/24/2020 7:44 AM Performed by: Thelma Comp, CRNA Pre-anesthesia Checklist: Patient identified, Emergency Drugs available, Suction available and Patient being monitored Patient Re-evaluated:Patient Re-evaluated prior to induction Oxygen Delivery Method: Circle System Utilized Preoxygenation: Pre-oxygenation with 100% oxygen Induction Type: IV induction Ventilation: Mask ventilation without difficulty Laryngoscope Size: Mac and 4 Grade View: Grade I Tube type: Oral Tube size: 7.5 mm Number of attempts: 1 Airway Equipment and Method: Stylet Placement Confirmation: ETT inserted through vocal cords under direct vision,  positive ETCO2 and breath sounds checked- equal and bilateral Secured at: 23 cm Tube secured with: Tape Dental Injury: Teeth and Oropharynx as per pre-operative assessment

## 2020-04-24 NOTE — Op Note (Signed)
DATE OF SERVICE: 04/24/2020  PATIENT:  Brett Cervantes  60 y.o. male  PRE-OPERATIVE DIAGNOSIS:  34mm descending thoracic aortic aneurysm  POST-OPERATIVE DIAGNOSIS:  Same  PROCEDURE:   1) US guided bilateral common femoral access and large bore closure (CPT [phone]) 2) Left common carotid artery - left subclavian artery bypass with 58mm Dacron (CPT [phone]) 3) Introduction of catheter into aorta (CPT 36200) 4) Radiologic interpretation for TEVAR (CPT [phone]) 5) Intravascular ultrasound of aortic arch (CPT 37252) 6) TEVAR with coverage of left subclavian artery with 37x151mm + 31x299mm CTAG (CPT 33880) 7) US guided left radial artery access 8) Arterial embolization of proximal left subclavian artery (CPT [phone])  SURGEON:  Surgeon(s) and Role:    * Leonie Douglas, MD - Primary    * Chuck Hint, MD - Assisting  ASSISTANT: Waverly Ferrari, MD   Nathanial Rancher, PA-C  An assistant was required to facilitate exposure and expedite the case.  ANESTHESIA:   general  EBL:  BLOOD ADMINISTERED:none  DRAINS: none   LOCAL MEDICATIONS USED:  NONE  SPECIMEN:  none  COUNTS: confirmed correct.  TOURNIQUET:  None  PATIENT DISPOSITION:  ICU - extubated and stable.   Delay start of Pharmacological VTE agent (>24hrs) due to surgical blood loss or risk of bleeding: no  INDICATION FOR PROCEDURE: Brett Cervantes is a 60 y.o. male with large descending thoracic aneurysm involving the origin of the left subclavian artery. After careful discussion of risks, benefits, and alternatives the patient was offered left carotid-subclavian bypass and TEVAR. We specifically discussed risk of stroke, chyle leak, CN injury, and the need for lifelong surveillance. The patient understood and wished to proceed.  OPERATIVE FINDINGS:  successful LCCA-LSCA bypass with palpable pulse in the left radial artery at completion of case Successful zone 2 TEVAR without endoleak Empiric coil embolization  of proximal left subclavian artery to reduce risk of future endoleak. Normal flow noted to carotid and vertebral arteries bilaterally upon completion  DESCRIPTION OF PROCEDURE: After identification of the patient in the pre-operative holding area, the patient was transferred to the operating room. The patient was positioned supine on the operating room table. Anesthesia was induced. The left neck, groins, and left wrist were prepped and draped in standard fashion. A surgical pause was performed confirming correct patient, procedure, and operative location.  A longitudinal incision was made in the base of the left neck. The incision was carried down through platysma. Subplatysmal flaps were elevated. The incision was carried down. Part of the clavicular head of the sternocleidomastoid was divided. The supraclavicular fat pad was mobilized by releasing the medial and inferior attachments. Exposure from this point was difficult because of an old clavicle fracture making the space confined. Dr. Edilia Bo joined the case and assisted me with exposing the left subclavian artery. We identified the phrenic nerve and protected it. The anterior scalene was divided. The subclavian artery was exposed and encircled proximally and distally.   We then exposed the common carotid. The carotid sheath was identified and skeletonized medially in the incision.  The jugular vein was retracted anteriorly.  The vagus nerve was identified and retracted anteriorly.  The carotid artery was skeletonized and encircled proximally distally.  Patient was systemically heparinized.  Activated clotting time measurements were used throughout the case to confirm adequate heparinization.  The subclavian artery was clamped proximally and distally.  An anterior arteriotomy was made with 11 blade and extended with Potts scissors.  An end-to-side anastomosis was performed to  a 8 mm Dacron prosthetic graft using continuous running suture of 6-0  Prolene.  The anastomosis was completed and flushed down the graft.  Hemostasis was achieved at the anastomotic site.  The graft was then tunneled over the phrenic nerve and under the jugular vein/vagus nerve to a end to side position next to the left common carotid artery.  The carotid artery was clamped proximally distally.  An anterior lateral arteriotomy was made on the common carotid artery.  The dacryon graft was cut to size measuring about 3 cm total in length.  An end-to-side anastomosis was performed between the carotid arteriotomy and dacryon graft using continuous running suture of 6-0 Prolene.  Prior to completion the bypass was generously flushed to avoid sending debris to the brain.  I released the clamp on the bypass, and then the proximal common carotid artery.  Several beats of systole were allowed to progress with the distal common carotid artery clamped.  Finally the distal clamp was released.  Hemostasis was ensured.  The wound was packed.  Tension was turned to the endovascular portion of the case.  Using ultrasound technique micropuncture access was obtained in the common femoral arteries bilaterally.  Perclose sutures were placed using "preclose" technique at 10:00 and 2 o'clock position in the right common femoral artery access.  The access was then upsized to 8 [place].  The wire and catheter exchanges a double curved Lunderquist wire was introduced into the root of the aorta.  Over the wire and IVUS catheter was introduced.  Careful measurements of the aneurysm were taken at our proximal landing zone.  We found that we had 2 cm of seal zone by IVUS measurement.  The proximalmost seal zone measured 31 mm.  We elected to use a 37 mm Gore graft to allow apposition in the larger, more distal zone 2 aorta.  Through the left common femoral artery access, series of wire and catheter exchanges a pigtail catheter was introduced into the root of the aorta.  We performed an aortogram and marked  our distal landing zone.  Next we introduced a 31 x 200 mm Gore C tag thoracic endovascular aortic prosthesis.  We deployed this in standard fashion in the distal aspect of the aneurysm taking great care to avoid unintentional coverage of the celiac artery or more than necessary of the descending thoracic aortic aneurysm to preserve spinal cord flow.  We then performed an additional aortogram and marked our proximal landing zone.  Introduced a 37 x 150 mm C tag device into the arch of the aorta.  We carefully deployed this device using standard technique to cover right up to the origin of the left common carotid artery.  The graft deployed well.  Patient angiography was performed.  This revealed adequate filling of the innominate and left carotid arteries.  The left subclavian artery was opacified by the left common carotid to left subclavian artery bypass.  I closed the femoral arteriotomies using the preplaced Perclose sutures on the right and a minx device on the left.  Both techniques worked well.  Both groins were hemostatic at completion.  Doppler flow was confirmed in the feet.  Next we prepped and draped the left wrist to allow transradial access.  Using micropuncture technique access left radial artery.  A 5/6 French slender sheath was introduced in the radial artery.  Using a Bentson wire and Kumpe catheter I was able to navigate to the very proximal subclavian artery.  Geography was performed.  The  origin of the left vertebral artery was identified. I used five 12x139mm Nestor coils to embolize the end of the left subclavian artery.  Angiography was performed after completion and revealed adequate flow through the left vertebral artery via the bypass and no evidence of endoleak.  Left neck incision was closed using 3-0 Vicryl and 4-0 Monocryl.  A TR band was applied to the left wrist.  Patient awoke neurologically intact.  Upon completion of the case instrument and sharps counts were  confirmed correct. The patient was transferred to the PACU in good condition. I was present for all portions of the procedure.  [name]. [name], MD [facility] Phone Number: [phone] 04/24/2020 12:42 PM

## 2020-04-25 ENCOUNTER — Encounter (HOSPITAL_COMMUNITY): Payer: Self-pay | Admitting: Vascular Surgery

## 2020-04-25 DIAGNOSIS — I712 Thoracic aortic aneurysm, without rupture: Principal | ICD-10-CM

## 2020-04-25 LAB — CBC
HCT: 37.5 % — ABNORMAL LOW (ref 39.0–52.0)
HCT: 36.2 % — ABNORMAL LOW (ref 39.0–52.0)
Hemoglobin: 13.1 g/dL (ref 13.0–17.0)
Hemoglobin: 12.4 g/dL — ABNORMAL LOW (ref 13.0–17.0)
MCH: 32.3 pg (ref 26.0–34.0)
MCH: 31.9 pg (ref 26.0–34.0)
MCHC: 34.9 g/dL (ref 30.0–36.0)
MCHC: 34.3 g/dL (ref 30.0–36.0)
MCV: 92.4 fL (ref 80.0–100.0)
MCV: 93.1 fL (ref 80.0–100.0)
Platelets: 105 10*3/uL — ABNORMAL LOW (ref 150–400)
Platelets: 110 10*3/uL — ABNORMAL LOW (ref 150–400)
RBC: 4.06 MIL/uL — ABNORMAL LOW (ref 4.22–5.81)
RBC: 3.89 MIL/uL — ABNORMAL LOW (ref 4.22–5.81)
RDW: 11.8 % (ref 11.5–15.5)
RDW: 11.9 % (ref 11.5–15.5)
WBC: 11.3 10*3/uL — ABNORMAL HIGH (ref 4.0–10.5)
WBC: 13.6 10*3/uL — ABNORMAL HIGH (ref 4.0–10.5)
nRBC: 0 % (ref 0.0–0.2)
nRBC: 0 % (ref 0.0–0.2)

## 2020-04-25 LAB — BASIC METABOLIC PANEL
Anion gap: 10 (ref 5–15)
Anion gap: 10 (ref 5–15)
BUN: 22 mg/dL — ABNORMAL HIGH (ref 6–20)
BUN: 21 mg/dL — ABNORMAL HIGH (ref 6–20)
CO2: 24 mmol/L (ref 22–32)
CO2: 22 mmol/L (ref 22–32)
Calcium: 8.5 mg/dL — ABNORMAL LOW (ref 8.9–10.3)
Calcium: 8.3 mg/dL — ABNORMAL LOW (ref 8.9–10.3)
Chloride: 104 mmol/L (ref 98–111)
Chloride: 105 mmol/L (ref 98–111)
Creatinine, Ser: 1.27 mg/dL — ABNORMAL HIGH (ref 0.61–1.24)
Creatinine, Ser: 1.16 mg/dL (ref 0.61–1.24)
GFR, Estimated: >60 mL/min (ref >60)
GFR, Estimated: >60 mL/min (ref >60)
Glucose, Bld: 165 mg/dL — ABNORMAL HIGH (ref 70–99)
Glucose, Bld: 128 mg/dL — ABNORMAL HIGH (ref 70–99)
Potassium: 4.1 mmol/L (ref 3.5–5.1)
Potassium: 4 mmol/L (ref 3.5–5.1)
Sodium: 138 mmol/L (ref 135–145)
Sodium: 137 mmol/L (ref 135–145)

## 2020-04-25 MED ORDER — ATORVASTATIN CALCIUM 20 MG PO TABS: 20.0000 mg | ORAL_TABLET | Freq: Every day | ORAL | 3 refills | Status: DC

## 2020-04-25 MED ORDER — METOPROLOL SUCCINATE ER 25 MG PO TB24: 25.0000 mg | ORAL_TABLET | Freq: Every day | ORAL | 3 refills | Status: DC

## 2020-04-25 MED ORDER — ASPIRIN 81 MG PO TBEC
81.0000 mg | DELAYED_RELEASE_TABLET | Freq: Every day | ORAL | Status: AC
Start: 2020-04-26 — End: ?

## 2020-04-25 MED ORDER — OXYCODONE-ACETAMINOPHEN 5-325 MG PO TABS: 1.0000 | ORAL_TABLET | Freq: Four times a day (QID) | ORAL | 0 refills | Status: DC | PRN

## 2020-04-25 NOTE — Anesthesia Postprocedure Evaluation (Signed)
Anesthesia Post Note  Patient: Brett Cervantes  Procedure(s) Performed: LEFT CAROTID-SUBCLAVIAN BYPASS GRAFT USING 73mm HEMASHIELD GRAFT (Left Neck) THORACIC AORTIC ENDOVASCULAR STENT GRAFT (N/A ) ULTRASOUND GUIDANCE FOR VASCULAR ACCESS (N/A ) LEFT SUBCLAVIAN ARTERY COILING (Left )     Patient location during evaluation: PACU Anesthesia Type: General Level of consciousness: awake and alert Pain management: pain level controlled Vital Signs Assessment: post-procedure vital signs reviewed and stable Respiratory status: spontaneous breathing, nonlabored ventilation, respiratory function stable and patient connected to nasal cannula oxygen Cardiovascular status: blood pressure returned to baseline and stable Postop Assessment: no apparent nausea or vomiting Anesthetic complications: no   No complications documented.  Last Vitals:  Vitals:   04/25/20 0900 04/25/20 0915  BP: 128/74 128/74  Pulse: 89 88  Resp: 20   Temp:    SpO2: 100%     Last Pain:  Vitals:   04/25/20 0812  TempSrc: Oral  PainSc:                  , S

## 2020-04-25 NOTE — Progress Notes (Addendum)
Progress Note    04/25/2020 7:11 AM 1 Day Post-Op  Subjective:  Says he's pretty sore but doing ok.  Ate all of his breakfast.  Afebrile HR 80's-100's NSR 100's-120's systolic 93-100% RA  Gtts:  none  Vitals:   04/25/20 0500 04/25/20 0600  BP: 114/70 129/76  Pulse: (!) 104 96  Resp: (!) 21 15  Temp:    SpO2: 99% 99%    Physical Exam: Cardiac:  regular Lungs:  Non labored Incisions:  Left neck incision is clean with scant bloody drainage and steri strips in tact; bilateral groins are soft Extremities:  Easily palpable bilateral ulnar pulses as well as bilateral DP/PT pulses Abdomen:  Soft, NT/ND  CBC    Component Value Date/Time   WBC 13.6 (H) 04/25/2020 0517   RBC 3.89 (L) 04/25/2020 0517   HGB 12.4 (L) 04/25/2020 0517   HGB 14.9 04/10/2020 0854   HCT 36.2 (L) 04/25/2020 0517   HCT 44.2 04/10/2020 0854   PLT 110 (L) 04/25/2020 0517   PLT 184 04/10/2020 0854   MCV 93.1 04/25/2020 0517   MCV 93 04/10/2020 0854   MCH 31.9 04/25/2020 0517   MCHC 34.3 04/25/2020 0517   RDW 11.9 04/25/2020 0517   RDW 11.5 (L) 04/10/2020 0854   LYMPHSABS 1.0 09/02/2016 1514   MONOABS 0.6 09/02/2016 1514   EOSABS 0.1 09/02/2016 1514   BASOSABS 0.0 09/02/2016 1514    BMET    Component Value Date/Time   NA 137 04/25/2020 0517   NA 140 04/10/2020 0854   K 4.0 04/25/2020 0517   CL 105 04/25/2020 0517   CO2 22 04/25/2020 0517   GLUCOSE 128 (H) 04/25/2020 0517   BUN 21 (H) 04/25/2020 0517   BUN 17 04/10/2020 0854   CREATININE 1.16 04/25/2020 0517   CALCIUM 8.3 (L) 04/25/2020 0517   GFRNONAA >60 04/25/2020 0517   GFRAA 83 04/10/2020 0854    INR    Component Value Date/Time   INR 1.1 04/24/2020 1310     Intake/Output Summary (Last 24 hours) at 04/25/2020 0711 Last data filed at 04/25/2020 0600 Gross per 24 hour  Intake 2684.65 ml  Output 1675 ml  Net 1009.65 ml     Assessment:  60 y.o. male is s/p:  Left CCA to left SCA bypass, embolization of left proximal  subclavian artery and TEVAR  1 Day Post-Op  Plan: -pt doing well this morning with palpable bilateral ulnar pulses and pedal pulses.  Incisions look fine.  -creatinine improved from earlier this am at 1.16 from 1.27.  Ok to [employer] IVF Thrombocytopenia - decreased from pre op but up from earlier this morning. -pt most likely discharge today or transfer to 4 east.  Pt's daughter who lives with him has a drug addiction.  He does not like pain medication.  Discussed with him that we would only send 8 tablets to his pharmacy and if he needs them, he can get them, otherwise, he will take Tylenol.   -mobilize  -foley removed this am-needs to void.  He does feel the urge. -needs to mobilize.  -f/u with Dr. [name] in 4 weeks with CTA -pt not on asa or statin pre-op.  rx sent for these to his pharmacy.   Doreatha Massed, PA-C [facility] [phone] 04/25/2020 7:11 AM  VASCULAR STAFF ADDENDUM: I have independently interviewed and examined the patient. I agree with the above.  Looks great s/p LCCA-LSCA bypass and Zone 2 TEVAR Already eating and mobilizing. Foley out  and has voided. Transfer to 4E. OK to DC this afternoon if still feeling well. Follow up with me in 4 weeks with CTA chest /abdomen / pelvis.  [name]. [name], MD [facility] Phone Number: [phone] 04/25/2020 8:39 AM

## 2020-04-25 NOTE — Discharge Summary (Signed)
EVAR Discharge Summary   Brett Cervantes 07-04-1960 60 y.o. male  MRN: [id number]  Admission Date: 04/21/2020  Discharge Date: 04/25/2020  Physician: Leonie Douglas, MD  Admission Diagnosis: Aortic dissection distal to left subclavian [facility]) [I71.01] Descending thoracic aortic aneurysm (HCC) [I71.2]   HPI:   This is a 60 y.o. male with large descending thoracic aneurysm involving the origin of the left subclavian artery. After careful discussion of risks, benefits, and alternatives the patient was offered left carotid-subclavian bypass and TEVAR. We specifically discussed risk of stroke, chyle leak, CN injury, and the need for lifelong surveillance. The patient understood and wished to proceed.  Hospital Course:  The patient was admitted to the hospital for pain and blood pressure control and then taken to the operating room on 04/24/2020 and underwent: 1) US guided bilateral common femoral access and large bore closure (CPT 34713) 2) Left common carotid artery - left subclavian artery bypass with 33mm Dacron (CPT [phone]) 3) Introduction of catheter into aorta (CPT 36200) 4) Radiologic interpretation for TEVAR (CPT [phone]) 5) Intravascular ultrasound of aortic arch (CPT 37252) 6) TEVAR with coverage of left subclavian artery with 37x142mm + 31x247mm CTAG (CPT 33880) 7) US guided left radial artery access 8) Arterial embolization of proximal left subclavian artery (CPT [phone])    Findings: successful LCCA-LSCA bypass with palpable pulse in the left radial artery at completion of case Successful zone 2 TEVAR without endoleak Empiric coil embolization of proximal left subclavian artery to reduce risk of future endoleak. Normal flow noted to carotid and vertebral arteries bilaterally upon completion  The pt tolerated the procedure well and was transported to the PACU in good condition.   By POD 1, pt was doing well.  palpable bilateral ulnar pulses and pedal pulses.  Incisions  look fine.  -creatinine improved from earlier this am at 1.16 from 1.27.  Ok to [employer] IVF Thrombocytopenia - decreased from pre op but up from earlier this morning. -pt most likely discharge today or transfer to 4 east.  Pt's daughter who lives with him has a drug addiction.  He does not like pain medication.  Discussed with him that we would only send 8 tablets to his pharmacy and if he needs them, he can get them, otherwise, he will take Tylenol.    His foley was removed and he was able to void.  He has been started on an asa and statin.     CBC    Component Value Date/Time   WBC 13.6 (H) 04/25/2020 0517   RBC 3.89 (L) 04/25/2020 0517   HGB 12.4 (L) 04/25/2020 0517   HGB 14.9 04/10/2020 0854   HCT 36.2 (L) 04/25/2020 0517   HCT 44.2 04/10/2020 0854   PLT 110 (L) 04/25/2020 0517   PLT 184 04/10/2020 0854   MCV 93.1 04/25/2020 0517   MCV 93 04/10/2020 0854   MCH 31.9 04/25/2020 0517   MCHC 34.3 04/25/2020 0517   RDW 11.9 04/25/2020 0517   RDW 11.5 (L) 04/10/2020 0854   LYMPHSABS 1.0 09/02/2016 1514   MONOABS 0.6 09/02/2016 1514   EOSABS 0.1 09/02/2016 1514   BASOSABS 0.0 09/02/2016 1514    BMET    Component Value Date/Time   NA 137 04/25/2020 0517   NA 140 04/10/2020 0854   K 4.0 04/25/2020 0517   CL 105 04/25/2020 0517   CO2 22 04/25/2020 0517   GLUCOSE 128 (H) 04/25/2020 0517   BUN 21 (H) 04/25/2020 0517   BUN 17 04/10/2020  0854   CREATININE 1.16 04/25/2020 0517   CALCIUM 8.3 (L) 04/25/2020 0517   GFRNONAA >60 04/25/2020 0517   GFRAA 83 04/10/2020 0854       Discharge Instructions    Discharge patient   Complete by: As directed    Discharge disposition: 01-Home or Self Care   Discharge patient date: 04/25/2020   Discharge patient   Complete by: As directed    Discharge after pt has ambulated.   Discharge disposition: 01-Home or Self Care   Discharge patient date: 04/25/2020      Discharge Diagnosis:  Aortic dissection distal to left subclavian (HCC)  [I71.01] Descending thoracic aortic aneurysm (HCC) [I71.2]  Secondary Diagnosis: Patient Active Problem List   Diagnosis Date Noted  . Coronary artery calcification seen on CT scan 04/21/2020  . Essential hypertension 04/21/2020  . Descending thoracic aortic aneurysm (HCC)   . Preoperative cardiovascular examination    Past Medical History:  Diagnosis Date  . Hypertension      Allergies as of 04/25/2020      Reactions   Ibuprofen Other (See Comments)   tachycardia      Medication List    STOP taking these medications   metoprolol tartrate 100 MG tablet Commonly known as: LOPRESSOR   naproxen 500 MG tablet Commonly known as: Naprosyn     TAKE these medications   amLODipine 5 MG tablet Commonly known as: NORVASC Take 5 mg by mouth daily.   aspirin 81 MG EC tablet Take 1 tablet (81 mg total) by mouth daily at 6 (six) AM. Swallow whole. Start taking on: April 26, 2020   atorvastatin 20 MG tablet Commonly known as: LIPITOR Take 1 tablet (20 mg total) by mouth at bedtime.   oxyCODONE-acetaminophen 5-325 MG tablet Commonly known as: Percocet Take 1 tablet by mouth every 6 (six) hours as needed for severe pain.       Discharge Instructions:  [facility]  Discharge Instructions Endovascular Aortic Aneurysm Repair  Please refer to the following instructions for your post-procedure care. Your surgeon or Physician Assistant will discuss any changes with you.  Activity  You are encouraged to walk as much as you can. You can slowly return to normal activities but must avoid strenuous activity and heavy lifting until your doctor tells you it's OK. Avoid activities such as vacuuming or swinging a gold club. It is normal to feel tired for several weeks after your surgery. Do not drive until your doctor gives the OK and you are no longer taking prescription pain medications. It is also normal to have difficulty with sleep habits, eating,  and bowel movements after surgery. These will go away with time.  Bathing/Showering  You may shower after you go home. If you have an incision, do not soak in a bathtub, hot tub, or swim until the incision heals completely.  Incision Care  Shower every day. Clean your incision with mild soap and water. Pat the area dry with a clean towel. You do not need a bandage unless otherwise instructed. Do not apply any ointments or creams to your incision. If you clothing is irritating, you may cover your incision with a dry gauze pad.  Diet  Resume your normal diet. There are no special food restrictions following this procedure. A low fat/low cholesterol diet is recommended for all patients with vascular disease. In order to heal from your surgery, it is CRITICAL to get adequate nutrition. Your body requires vitamins, minerals, and protein.  Vegetables are the best source of vitamins and minerals. Vegetables also provide the perfect balance of protein. Processed food has little nutritional value, so try to avoid this.  Medications  Resume taking all of your medications unless your doctor or Physician Assistnat tells you not to. If your incision is causing pain, you may take over-the-counter pain relievers such as acetaminophen (Tylenol). If you were prescribed a stronger pain medication, please be aware these medications can cause nausea and constipation. Prevent nausea by taking the medication with a snack or meal. Avoid constipation by drinking plenty of fluids and eating foods with a high amount of fiber, such as fruits, vegetables, and grains.  Do not take Tylenol if you are taking prescription pain medications.   Follow up  Our office will schedule a follow-up appointment with a C.T. scan 3-4 weeks after your surgery.  Please call us immediately for any of the following conditions  . Severe or worsening pain in your legs or feet or in your abdomen back or chest. . Increased pain, redness,  drainage (pus) from your incision sit. . Increased abdominal pain, bloating, nausea, vomiting or persistent diarrhea. . Fever of 101 degrees or higher. . Swelling in your leg (s), .  Reduce your risk of vascular disease  .Stop smoking. If you would like help call [facility] at 1-800-QUIT-NOW (513-879-3779) or [place] at 626-259-0426. .Manage your cholesterol .Maintain a desired weight .Control your diabetes .Keep your blood pressure down  If you have questions, please call the office at [phone].   Prescriptions given: 1.  Roxicet #8 No Refill 2.  Asa 81mg  daily 3.  Lipitor 20mg  qhs #30 (3 refills) 4.  Toprol XL 25mg  daily #30 (3 refills) (refills per PCP or cardiology)  Disposition: home  Patient's condition: is Good  Follow up: 1. Dr. in 4 weeks with CTA protocol   , PA-C [facility] [phone] 04/25/2020  1:22 PM   - For VQI Registry use - Post-op:  Time to Extubation: [x]  In OR, [ ]  < 12 hrs, [ ]  12-24 hrs, [ ]  >=24 hrs Vasopressors Req. Post-op: No MI: No., [ ]  Troponin only, [ ]  EKG or Clinical New Arrhythmia: No CHF: No ICU Stay: 1 day in ICU Transfusion: No     If yes, n/a units given  Complications: Resp failure: No., [ ]  Pneumonia, [ ]  Ventilator Chg in renal function: No., [ ]  Inc. Cr > 0.5, [ ]  Temp. Dialysis,  [ ]  Permanent dialysis Leg ischemia: No., no Surgery needed, [ ]  Yes, Surgery needed,  [ ]  Amputation Bowel ischemia: No., [ ]  Medical Rx, [ ]  Surgical Rx Wound complication: No., [ ]  Superficial separation/infection, [ ]  Return to OR Return to OR: No  Return to OR for bleeding: No Stroke: No., [ ]  Minor, [ ]  Major  Discharge medications: Statin use:  Yes  ASA use:  Yes  Plavix use:  No  Beta blocker use:  Yes ARB use:  No ACEI use:  No CCB use:  No

## 2020-04-26 LAB — TYPE AND SCREEN
ABO/RH(D): B POS
Antibody Screen: NEGATIVE
Unit division: 0
Unit division: 0

## 2020-04-26 LAB — BPAM RBC
Blood Product Expiration Date: 202203022359
Blood Product Expiration Date: 202203072359
ISSUE DATE / TIME: 202202150920
ISSUE DATE / TIME: 202202150920
Unit Type and Rh: 7300
Unit Type and Rh: 7300

## 2020-05-22 ENCOUNTER — Other Ambulatory Visit: Payer: Self-pay

## 2020-05-22 DIAGNOSIS — I712 Thoracic aortic aneurysm, without rupture, unspecified: Secondary | ICD-10-CM

## 2020-05-23 ENCOUNTER — Encounter: Payer: BC Managed Care – PPO | Admitting: Vascular Surgery

## 2020-05-26 ENCOUNTER — Ambulatory Visit
Admission: RE | Admit: 2020-05-26 | Discharge: 2020-05-26 | Disposition: A | Discharge status: Home / Self Care | Payer: BC Managed Care – PPO | Source: Ambulatory Visit | Attending: Vascular Surgery | Admitting: Vascular Surgery

## 2020-05-26 DIAGNOSIS — Q632 Ectopic kidney: Secondary | ICD-10-CM | POA: Diagnosis not present

## 2020-05-26 DIAGNOSIS — I712 Thoracic aortic aneurysm, without rupture, unspecified: Secondary | ICD-10-CM

## 2020-05-26 DIAGNOSIS — K5750 Diverticulosis of both small and large intestine without perforation or abscess without bleeding: Secondary | ICD-10-CM | POA: Diagnosis not present

## 2020-05-26 DIAGNOSIS — Q600 Renal agenesis, unilateral: Secondary | ICD-10-CM | POA: Diagnosis not present

## 2020-05-26 IMAGING — CT CT CTA ABD/PEL W/CM AND/OR W/O CM
4 of 12 series · 14 of 37 positions shown · non-contrast
Comparison: CT chest angiogram [DATE]

CLINICAL DATA: KLPIGBB follow-up, status post stent endograft repair

EXAM:
CT ANGIOGRAPHY CHEST, ABDOMEN AND PELVIS
TECHNIQUE: Non-contrast CT of the chest was initially obtained.

[Series 9: chest w/o 2.00 br40 s3 axial · axial · non-contrast · 0.60mm/px · z∈[+1705,+1811]mm · 2 of 159 slices shown]
[im 53/159  lung]
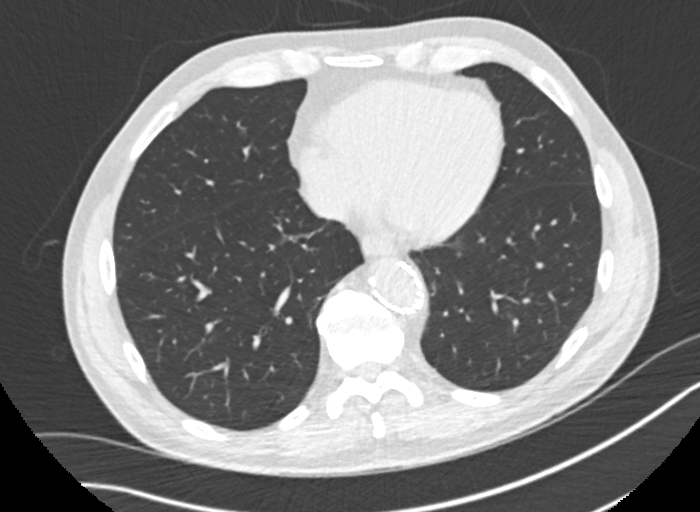
[im 106/159  lung]
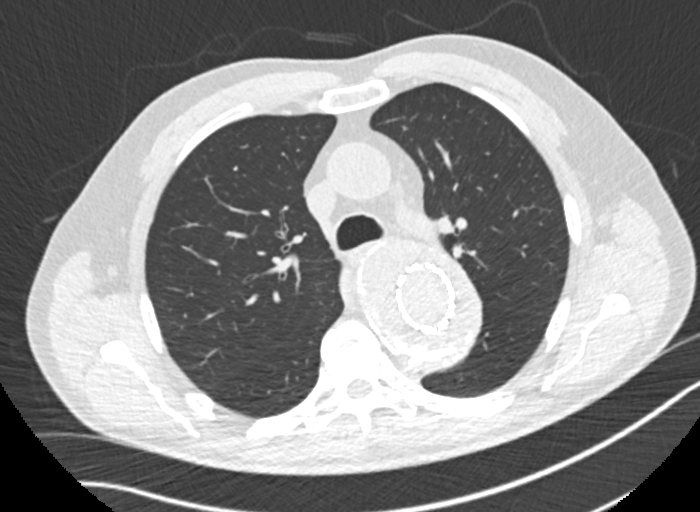

[Series 13: cta cap stent 2.00 bv36 s3 axial arterial · axial · arterial · 0.74mm/px · z∈[+1401,+1813]mm · 5 of 310 slices shown]
[im 52/310  lung]
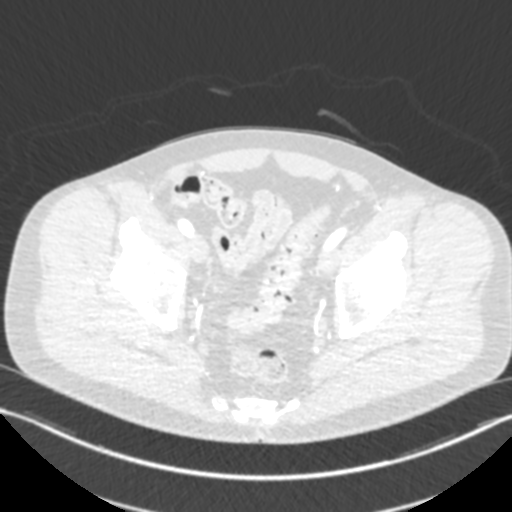
[im 104/310  mediastinal]
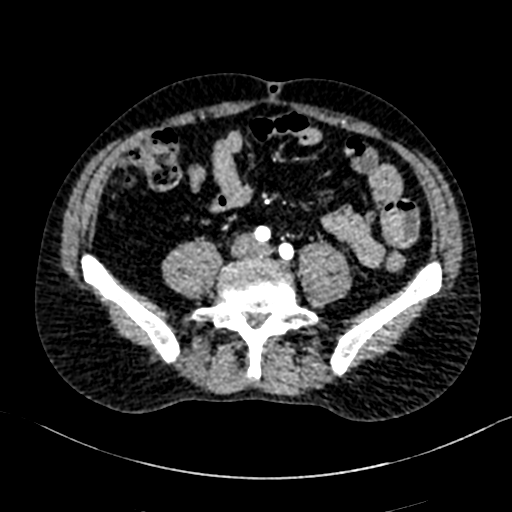
[im 155/310  lung]
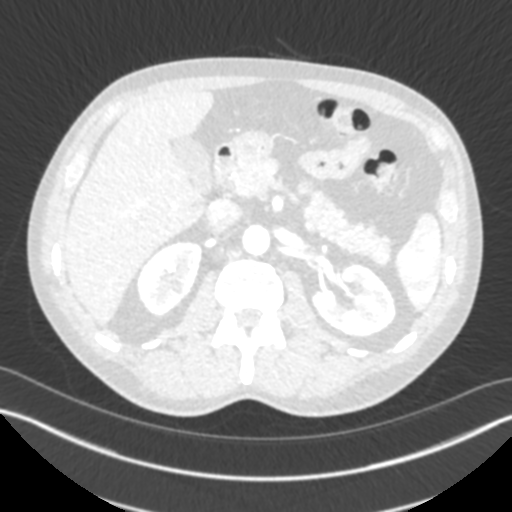
[im 207/310  mediastinal]
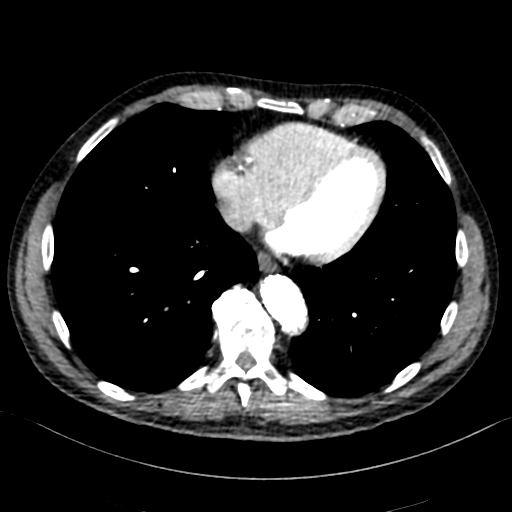
[im 258/310  lung]
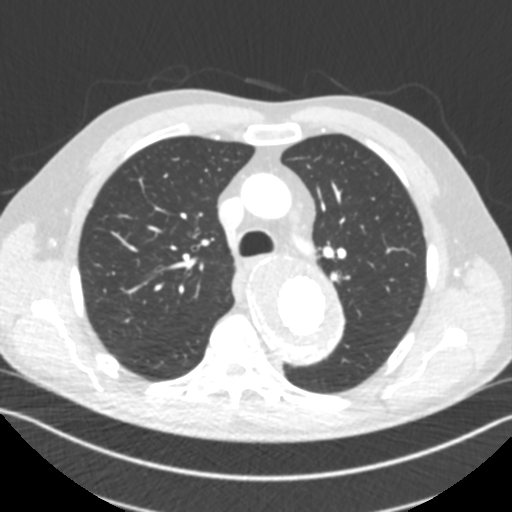

[Series 15: cta cap stent 2.00 br60 s3 axial lungs art · axial · 0.74mm/px · z∈[+1401,+1813]mm · 5 of 310 slices shown]
[im 52/310  lung]
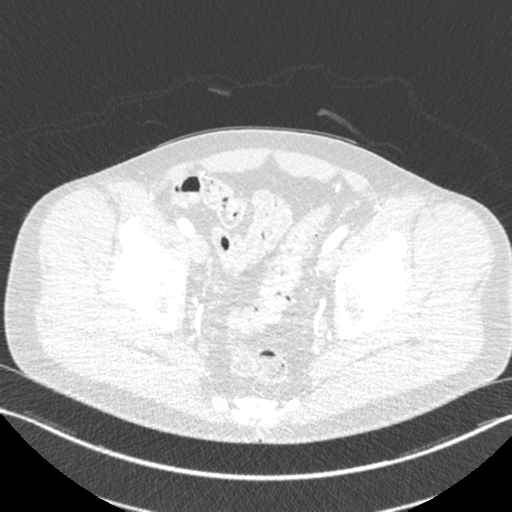
[im 104/310  lung]
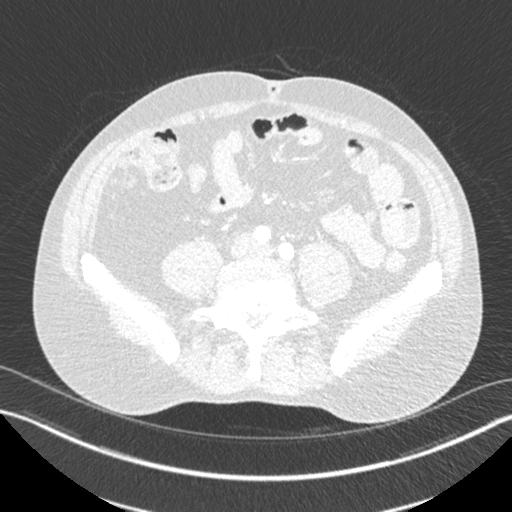
[im 155/310  lung]
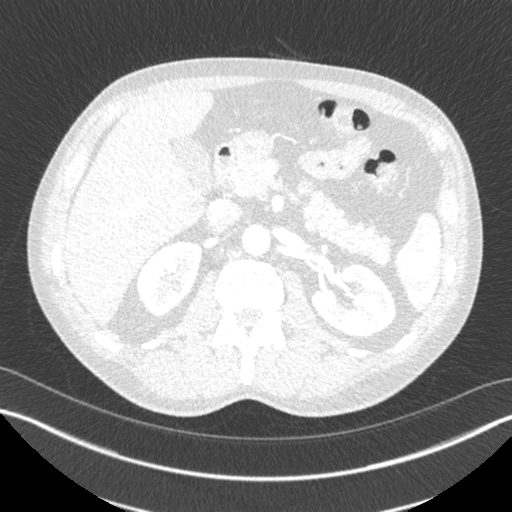
[im 207/310  lung]
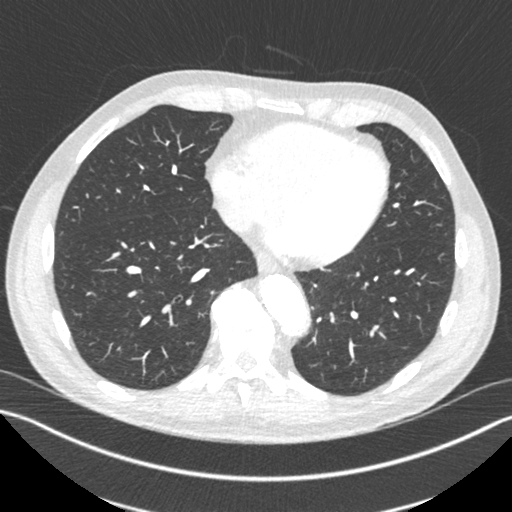
[im 258/310  lung]
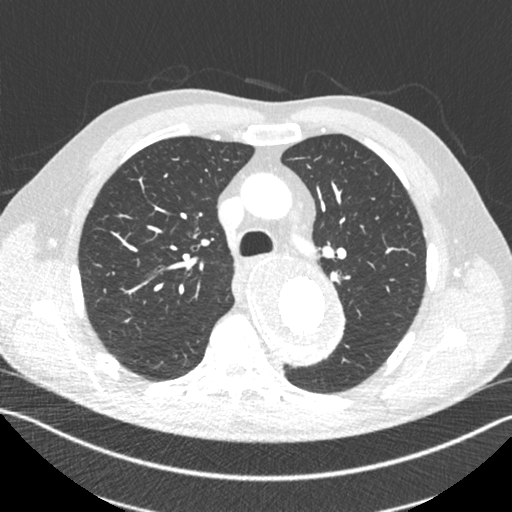

[Series 17: cta cap stent 2.00 bl60 s3 lungs · axial · 0.74mm/px · z∈[+1707,+1811]mm · 2 of 158 slices shown]
[im 53/158  lung]
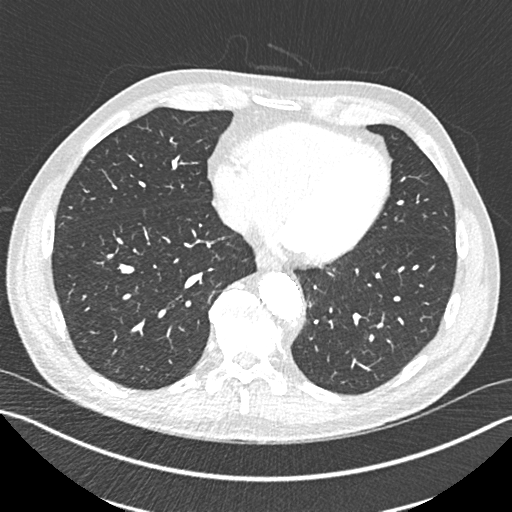
[im 105/158  lung]
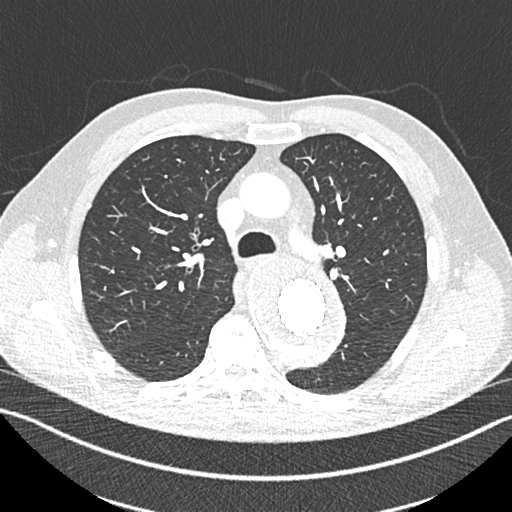

[14 of 37 positions shown; findings below may reference images not displayed]

Multidetector CT imaging through the chest, abdomen and pelvis was
performed using the standard protocol during bolus administration of
intravenous contrast. Multiplanar reconstructed images and MIPs were
obtained and reviewed to evaluate the vascular anatomy.

CONTRAST:  75mL [0Z] IOPAMIDOL ([0Z]) INJECTION 76%
FINDINGS: CTA CHEST FINDINGS

Cardiovascular: Preferential opacification of the thoracic aorta.
There has been interval stent endograft repair of an aneurysm of the
distal aortic arch and proximal descending thoracic aorta, as well
as coiling of the left subclavian artery origin and carotid
subclavian bypass. Stent landing zones appear to be in good
apposition without evidence of endoleak. Note that there is
calcification within the mural thrombus present prior to stenting,
this intrinsic high attenuation does not reflect endoleak
enhancement (series 30, image 67). The excluded aneurysm sac
measures approximately 7.7 x 7.4 cm in the proximal descending
aorta, not significantly changed compared to prior examination. The
tubular ascending thoracic aorta is normal in caliber measuring up
to 3.5 x 3.5 cm. Normal heart size. No pericardial effusion.

Mediastinum/Nodes: No enlarged mediastinal, hilar, or axillary lymph
nodes. Thyroid gland, trachea, and esophagus demonstrate no
significant findings.

Lungs/Pleura: Lungs are clear. No pleural effusion or pneumothorax.

Musculoskeletal: No chest wall abnormality. No acute or significant
osseous findings.

Review of the MIP images confirms the above findings.

CTA ABDOMEN AND PELVIS FINDINGS

VASCULAR

Normal contour and caliber of the abdominal aorta. No evidence of
aneurysm, dissection, and no significant atherosclerosis. Standard
branching pattern of the abdominal aorta with solitary bilateral
renal arteries. Maximum caliber of the infrarenal abdominal aorta is
1.9 x 1.8 cm.

Review of the MIP images confirms the above findings.

NON-VASCULAR

Hepatobiliary: No solid liver abnormality is seen. No gallstones,
gallbladder wall thickening, or biliary dilatation.

Pancreas: Unremarkable. No pancreatic ductal dilatation or
surrounding inflammatory changes.

Spleen: Normal in size without significant abnormality.

Adrenals/Urinary Tract: Adrenal glands are unremarkable. Kidneys are
normal, without renal calculi, solid lesion, or hydronephrosis.
Bladder is unremarkable.

Stomach/Bowel: Stomach is within normal limits. Appendix appears
normal. No evidence of bowel wall thickening, distention, or
inflammatory changes. Sigmoid diverticulosis.

Lymphatic: No enlarged abdominal or pelvic lymph nodes.

Reproductive: No mass or other significant abnormality.

Other: No abdominal wall hernia or abnormality. No abdominopelvic
ascites.

Musculoskeletal: No acute or significant osseous findings.

Review of the MIP images confirms the above findings.
IMPRESSION: 1. There has been interval stent endograft repair of an aneurysm of
the distal aortic arch and proximal descending thoracic aorta, as
well as coiling of the left subclavian artery origin and carotid
subclavian bypass. Stent landing zones appear to be in good
apposition without evidence of endoleak.
2. The excluded aneurysm sac measures approximately 7.7 x 7.4 cm in
the proximal descending thoracic aorta, not significantly changed
compared to prior examination.
3. The tubular ascending thoracic aorta is normal in caliber.
4. Normal contour and caliber of the abdominal aorta. No significant
abdominal aortic atherosclerosis.
5. Sigmoid diverticulosis.

## 2020-05-26 MED ORDER — IOPAMIDOL (ISOVUE-370) INJECTION 76%
75.0000 mL | Freq: Once | INTRAVENOUS | Status: AC | PRN
  Administered 2020-05-26: 75 mL via INTRAVENOUS

## 2020-05-30 ENCOUNTER — Ambulatory Visit (INDEPENDENT_AMBULATORY_CARE_PROVIDER_SITE_OTHER): Payer: Self-pay | Admitting: Vascular Surgery

## 2020-05-30 ENCOUNTER — Other Ambulatory Visit: Payer: Self-pay

## 2020-05-30 ENCOUNTER — Encounter: Payer: Self-pay | Admitting: Vascular Surgery

## 2020-05-30 VITALS — BP 121/77 | HR 74 | Temp 98.2°F | Resp 20 | Ht 70.0 in | Wt 185.0 lb

## 2020-05-30 DIAGNOSIS — Z95828 Presence of other vascular implants and grafts: Secondary | ICD-10-CM

## 2020-05-30 NOTE — Progress Notes (Signed)
[facility] OF [place] PROGRESS NOTE  ASSESSMENT / PLAN: Brett Cervantes is a 60 y.o. male status post Zone 2 TEVAR, left carotid-subclavian bypass 04/24/20 for 71mm descending thoracic aortic aneurysm. Follow up CTA shows good technical result - no evidence of endoleak, bypass widely patent. Clinically he looks excellent. I cleared him for activity as tolerated. Follow up with cardiology for continued CAD workup. Will plan on surveillance with me annually with CTA.  SUBJECTIVE: No complaints. Looks and feels great.  OBJECTIVE: BP 121/77 (BP Location: Left Arm, Patient Position: Sitting, Cuff Size: Normal)   Pulse 74   Temp 98.2 F (36.8 C)   Resp 20   Ht 5\' 10"  (1.778 m)   Wt 185 lb (83.9 kg)   SpO2 100%   BMI 26.54 kg/m   Left collar incision well healed 2+ L radial pulse  CBC Latest Ref Rng & Units 04/25/2020 04/25/2020 04/24/2020  WBC 4.0 - 10.5 K/uL 13.6(H) 11.3(H) 10.4  Hemoglobin 13.0 - 17.0 g/dL 12.4(L) 13.1 13.6  Hematocrit 39.0 - 52.0 % 36.2(L) 37.5(L) 39.5  Platelets 150 - 400 K/uL 110(L) 105(L) 133(L)     CMP Latest Ref Rng & Units 04/25/2020 04/25/2020 04/24/2020  Glucose 70 - 99 mg/dL 04/26/2020) 009(F) 818(E)  BUN 6 - 20 mg/dL 993(Z) 16(R) 67(E)  Creatinine 0.61 - 1.24 mg/dL 93(Y 1.01) 7.51(W  Sodium 135 - 145 mmol/L 137 138 138  Potassium 3.5 - 5.1 mmol/L 4.0 4.1 4.1  Chloride 98 - 111 mmol/L 105 104 105  CO2 22 - 32 mmol/L 22 24 22   Calcium 8.9 - 10.3 mg/dL 8.3(L) 8.5(L) 8.3(L)  Total Protein 6.5 - 8.1 g/dL - - -  Total Bilirubin 0.3 - 1.2 mg/dL - - -  Alkaline Phos 38 - 126 U/L - - -  AST 15 - 41 U/L - - -  ALT 17 - 63 U/L - - -   CTA reviewed. No endoleak. Bypass patent. Remainder of aorta healthy.   2.58. , MD [facility] Phone Number: [phone] 05/30/2020 12:29 PM

## 2020-06-06 DIAGNOSIS — Z8669 Personal history of other diseases of the nervous system and sense organs: Secondary | ICD-10-CM | POA: Diagnosis not present

## 2020-06-06 DIAGNOSIS — I10 Essential (primary) hypertension: Secondary | ICD-10-CM | POA: Diagnosis not present

## 2020-06-06 DIAGNOSIS — Z8679 Personal history of other diseases of the circulatory system: Secondary | ICD-10-CM | POA: Diagnosis not present

## 2020-06-06 DIAGNOSIS — M1990 Unspecified osteoarthritis, unspecified site: Secondary | ICD-10-CM | POA: Diagnosis not present

## 2020-06-06 DIAGNOSIS — Z5181 Encounter for therapeutic drug level monitoring: Secondary | ICD-10-CM | POA: Diagnosis not present

## 2020-06-19 ENCOUNTER — Other Ambulatory Visit: Payer: Self-pay | Admitting: Vascular Surgery

## 2020-06-19 DIAGNOSIS — I712 Thoracic aortic aneurysm, without rupture, unspecified: Secondary | ICD-10-CM

## 2020-07-04 DIAGNOSIS — K5790 Diverticulosis of intestine, part unspecified, without perforation or abscess without bleeding: Secondary | ICD-10-CM | POA: Diagnosis not present

## 2020-07-04 DIAGNOSIS — R1032 Left lower quadrant pain: Secondary | ICD-10-CM | POA: Diagnosis not present

## 2020-07-23 NOTE — Progress Notes (Signed)
Cardiology Office Note:   Date:  07/24/2020  NAME:  Brett Cervantes    MRN: [id number] DOB:  1960-09-14   PCP:  Ileana Ladd, MD  Cardiologist:  None  Electrophysiologist:  None   Referring MD: Darrin Nipper Family Judie Petit*   Chief Complaint  Patient presents with  . Follow-up   History of Present Illness:   Brett Cervantes is a 60 y.o. male with a hx of descending thoracic aortic aneurysm, hypertension, hyperlipidemia, coronary calcium presents for follow-up.  He was seen for vague symptoms of chest pain.  Underwent cardiac CT.  Found to have a descending thoracic aortic aneurysm.  It was very large up to 7.8 cm.  He was admitted to the hospital and underwent T EVAR.  Underwent left common carotid artery and left subclavian bypass.  Recent CT chest shows stable repair.  He reports he is doing well.  He is still cutting grass and doing painting.  He has been released from restrictions by his vascular surgeon.  He denies any chest pain or trouble breathing.  Blood pressure 120/68.  Most recent LDL cholesterol 71.  He is on a statin.  He is a never smoker.  He is on an diagnosed with hypertension recently.  No family history of aneurysms.  He does not have any children's stepchildren.  His intermittent episodes of back pain have improved.  Overall he is doing well since surgery.  He is on aspirin and statin.  He will stay on these indefinitely.  I did express the importance of blood pressure control.  Overall doing well.  Problem List 1. Descending Thoracic Aortic Aneurysm (7.8 cm) -S/p TEVAR with coverage of left subclavian  -Left common carotid artery/Left subclavian bypass 2. HTN 3. HLD -T chol 127, HDL 41, LDL 71, triglycerides 68 4. CAD -Calcium score 182 (79th percentile) -25-49% pLAD  Past Medical History: Past Medical History:  Diagnosis Date  . Hypertension     Past Surgical History: Past Surgical History:  Procedure Laterality Date  . ANEURYSM COILING Left 04/24/2020    Procedure: LEFT SUBCLAVIAN ARTERY COILING;  Surgeon: Leonie Douglas, MD;  Location[facility] OR;  Service: Vascular;  Laterality: Left;  . CAROTID-SUBCLAVIAN BYPASS GRAFT Left 04/24/2020   Procedure: LEFT CAROTID-SUBCLAVIAN BYPASS GRAFT USING 59mm HEMASHIELD GRAFT;  Surgeon: Leonie Douglas, MD;  Location: [facility] OR;  Service: Vascular;  Laterality: Left;  . THORACIC AORTIC ENDOVASCULAR STENT GRAFT N/A 04/24/2020   Procedure: THORACIC AORTIC ENDOVASCULAR STENT GRAFT;  Surgeon: Leonie Douglas, MD;  Location: [facility] OR;  Service: Vascular;  Laterality: N/A;  . ULTRASOUND GUIDANCE FOR VASCULAR ACCESS N/A 04/24/2020   Procedure: ULTRASOUND GUIDANCE FOR VASCULAR ACCESS;  Surgeon: Leonie Douglas, MD;  Location: [facility] OR;  Service: Vascular;  Laterality: N/A;    Current Medications: Current Meds  Medication Sig  . amLODipine (NORVASC) 5 MG tablet Take 5 mg by mouth daily.  Marland Kitchen aspirin EC 81 MG EC tablet Take 1 tablet (81 mg total) by mouth daily at 6 (six) AM. Swallow whole.  Marland Kitchen atorvastatin (LIPITOR) 20 MG tablet Take 1 tablet (20 mg total) by mouth at bedtime.  . [DISCONTINUED] metoprolol succinate (TOPROL-XL) 25 MG 24 hr tablet Take 1 tablet (25 mg total) by mouth daily.     Allergies:    Ibuprofen   Social History: Social History   Socioeconomic History  . Marital status: Married    Spouse name: Not on file  . Number of children: Not on file  .  Years of education: Not on file  . Highest education level: Not on file  Occupational History  . Occupation: Holiday representative   Tobacco Use  . Smoking status: Never Smoker  . Smokeless tobacco: Never Used  Vaping Use  . Vaping Use: Never used  Substance and Sexual Activity  . Alcohol use: Yes    Alcohol/week: 2.0 standard drinks    Types: 2 Cans of beer per week  . Drug use: No  . Sexual activity: Not on file  Other Topics Concern  . Not on file  Social History Narrative  . Not on file   Social Determinants of Health   Financial Resource Strain: Not  on file  Food Insecurity: Not on file  Transportation Needs: Not on file  Physical Activity: Not on file  Stress: Not on file  Social Connections: Not on file     Family History: The patient's family history includes Heart attack in his father.  ROS:   All other ROS reviewed and negative. Pertinent positives noted in the HPI.     EKGs/Labs/Other Studies Reviewed:   The following studies were personally reviewed by me today:  CT Chest Aorta  IMPRESSION: 1. There has been interval stent endograft repair of an aneurysm of the distal aortic arch and proximal descending thoracic aorta, as well as coiling of the left subclavian artery origin and carotid subclavian bypass. Stent landing zones appear to be in good apposition without evidence of endoleak. 2. The excluded aneurysm sac measures approximately 7.7 x 7.4 cm in the proximal descending thoracic aorta, not significantly changed compared to prior examination. 3. The tubular ascending thoracic aorta is normal in caliber. 4. Normal contour and caliber of the abdominal aorta. No significant abdominal aortic atherosclerosis. 5. Sigmoid diverticulosis.  Recent Labs: 04/10/2020: TSH 0.997 04/24/2020: Magnesium 1.8 04/25/2020: BUN 21; Creatinine, Ser 1.16; Hemoglobin 12.4; Platelets 110; Potassium 4.0; Sodium 137   Recent Lipid Panel    Component Value Date/Time   CHOL 151 04/23/2020 0200   TRIG 81 04/23/2020 0200   HDL 39 (L) 04/23/2020 0200   CHOLHDL 3.9 04/23/2020 0200   VLDL 16 04/23/2020 0200   LDLCALC 96 04/23/2020 0200    Physical Exam:   VS:  BP 120/68 (BP Location: Left Arm)   Pulse 78   Ht 5\' 11"  (1.803 m)   Wt 180 lb 9.6 oz (81.9 kg)   SpO2 98%   BMI 25.19 kg/m    Wt Readings from Last 3 Encounters:  07/24/20 180 lb 9.6 oz (81.9 kg)  05/30/20 185 lb (83.9 kg)  04/25/20 179 lb 1.6 oz (81.2 kg)    General: Well nourished, well developed, in no acute distress Head: Atraumatic, normal size  Eyes: PEERLA, EOMI   Neck: Supple, no JVD Endocrine: No thryomegaly Cardiac: Normal S1, S2; RRR; no murmurs, rubs, or gallops Lungs: Clear to auscultation bilaterally, no wheezing, rhonchi or rales  Abd: Soft, nontender, no hepatomegaly  Ext: No edema, pulses 2+ Musculoskeletal: No deformities, BUE and BLE strength normal and equal Skin: Warm and dry, no rashes   Neuro: Alert and oriented to person, place, time, and situation, CNII-XII grossly intact, no focal deficits  Psych: Normal mood and affect   ASSESSMENT:   Brett Cervantes is a 60 y.o. male who presents for the following: 1. Descending thoracic aortic aneurysm (HCC)   2. Primary hypertension   3. Agatston coronary artery calcium score between 100 and 199   4. Mixed hyperlipidemia     PLAN:  1. Descending thoracic aortic aneurysm (HCC) -Evaluated for nonspecific back and chest discomfort.  Found to have a 7.8 cm descending aortic aneurysm.  He underwent TEVAR as well as left carotid and left subclavian bypass. -Recent CT scan shows his graft is stable.  Bypass grafts are open.  Denies any symptoms.  Back to work. -Mainstay of treatment is control blood pressure and cholesterol lowering agents.  Seems to be doing well. -He will follow with vascular surgery for yearly CT scans.  He will let us know if he has any chest pain or other symptoms.  2. Primary hypertension -BP goal is less than 120/70.  BP 120/68 today.  Continue Norvasc 5 mg daily.  Continue metoprolol succinate 25 mg daily.  Would like his heart rate around 70.  3. Agatston coronary artery calcium score between 100 and 199 4. Mixed hyperlipidemia -Elevated coronary calcium score.  Nonobstructive CAD.  Continue aspirin.  Continue Lipitor 20 mg daily.  Most recent LDL cholesterol 71.  This is close enough.  Disposition: Return in about 1 year (around 07/24/2021).  Medication Adjustments/Labs and Tests Ordered: Current medicines are reviewed at length with the patient today.  Concerns  regarding medicines are outlined above.  No orders of the defined types were placed in this encounter.  Meds ordered this encounter  Medications  . metoprolol succinate (TOPROL-XL) 25 MG 24 hr tablet    Sig: Take 1 tablet (25 mg total) by mouth daily.    Dispense:  90 tablet    Refill:  3    Refills per cardiology or PCP    Patient Instructions  Medication Instructions:  The current medical regimen is effective;  continue present plan and medications.  *If you need a refill on your cardiac medications before your next appointment, please call your pharmacy*   Follow-Up: At [facility], you and your health needs are our priority.  As part of our continuing mission to provide you with exceptional heart care, we have created designated Provider Care Teams.  These Care Teams include your primary Cardiologist (physician) and Advanced Practice Providers (APPs -  Physician Assistants and Nurse Practitioners) who all work together to provide you with the care you need, when you need it.  We recommend signing up for the patient portal called "MyChart".  Sign up information is provided on this After Visit Summary.  MyChart is used to connect with patients for Virtual Visits (Telemedicine).  Patients are able to view lab/test results, encounter notes, upcoming appointments, etc.  Non-urgent messages can be sent to your provider as well.   To learn more about what you can do with MyChart, go to [email or web address].    Your next appointment:   12 month(s)  The format for your next appointment:   In Person  Provider:   Lennie Odor, MD        Time Spent with Patient: I have spent a total of 25 minutes with patient reviewing hospital notes, telemetry, EKGs, labs and examining the patient as well as establishing an assessment and plan that was discussed with the patient.  > 50% of time was spent in direct patient care.  Signed, Lenna Gilford. Flora Lipps, MD, [facility]  [facility]  [address] [place], [place] [address] [phone]  07/24/2020 8:51 AM

## 2020-07-24 ENCOUNTER — Ambulatory Visit (INDEPENDENT_AMBULATORY_CARE_PROVIDER_SITE_OTHER): Payer: BC Managed Care – PPO | Admitting: Cardiovascular Disease

## 2020-07-24 ENCOUNTER — Other Ambulatory Visit: Payer: Self-pay

## 2020-07-24 ENCOUNTER — Encounter: Payer: Self-pay | Admitting: Cardiovascular Disease

## 2020-07-24 VITALS — BP 120/68 | HR 78 | Ht 71.0 in | Wt 180.6 lb

## 2020-07-24 DIAGNOSIS — I712 Thoracic aortic aneurysm, without rupture: Secondary | ICD-10-CM | POA: Diagnosis not present

## 2020-07-24 DIAGNOSIS — I7123 Aneurysm of the descending thoracic aorta, without rupture: Secondary | ICD-10-CM

## 2020-07-24 DIAGNOSIS — I10 Essential (primary) hypertension: Secondary | ICD-10-CM | POA: Diagnosis not present

## 2020-07-24 DIAGNOSIS — E782 Mixed hyperlipidemia: Secondary | ICD-10-CM | POA: Diagnosis not present

## 2020-07-24 DIAGNOSIS — R931 Abnormal findings on diagnostic imaging of heart and coronary circulation: Secondary | ICD-10-CM | POA: Diagnosis not present

## 2020-07-24 MED ORDER — METOPROLOL SUCCINATE ER 25 MG PO TB24: 25.0000 mg | ORAL_TABLET | Freq: Every day | ORAL | 3 refills | Status: DC

## 2020-07-24 NOTE — Patient Instructions (Signed)

## 2020-08-08 ENCOUNTER — Telehealth: Payer: Self-pay

## 2020-08-08 DIAGNOSIS — Z006 Encounter for examination for normal comparison and control in clinical research program: Secondary | ICD-10-CM

## 2020-08-08 NOTE — Telephone Encounter (Signed)
I called patient for his 90-day Identify Study follow up phone call. Patient is doing well with no cardiac symptoms at this time. I reminded patient I would call him in February for his 1 year follow-up. 

## 2020-08-25 ENCOUNTER — Telehealth: Payer: Self-pay | Admitting: Cardiovascular Disease

## 2020-08-25 DIAGNOSIS — I712 Thoracic aortic aneurysm, without rupture, unspecified: Secondary | ICD-10-CM

## 2020-08-25 DIAGNOSIS — I7123 Aneurysm of the descending thoracic aorta, without rupture: Secondary | ICD-10-CM

## 2020-08-25 NOTE — Telephone Encounter (Signed)
Spoke with Brett Cervantes, he reports getting a feeling in his chest similar to when he had to have surgery for his aneurysm but it is not as bad. He will also notice palpitations if he walks too fast. It can occur with rest or exertion. Sometimes it will be in his back. He denies SOB. He is due to have another CT scan in the spring and is anxious about waiting that long. He also reports occ dizziness that mainly occurs when bending over or looking down. He reports he does not drink enough water and cautioned him about dehydration effects on bp. He is anxious and relates how he feels back to how he felt at the time or prior to the surgery. Aware will forward to dr Flora Lipps to review and advise.

## 2020-08-25 NOTE — Telephone Encounter (Signed)
Pt c/o of Chest Pain: STAT if CP now or developed within 24 hours  1. Are you having CP right now? Not at this time- it is more like a discomfort  2. Are you experiencing any other symptoms (ex. SOB, nausea, vomiting, sweating)? no  3. How long have you been experiencing CP? About a month ago, more often  4. Is your CP continuous or coming and going? Comes and goes  5. Have you taken Nitroglycerin? Pt does not have any nitroglycerinotno?

## 2020-08-28 NOTE — Telephone Encounter (Signed)
Spoke with pt, aware orders placed for CTA's.

## 2020-09-15 ENCOUNTER — Telehealth: Payer: Self-pay | Admitting: Cardiovascular Disease

## 2020-09-15 NOTE — Telephone Encounter (Signed)
Patient states he has not heard about scheduling his CT yet. In the Ct order there is a note stating "08/31/20 let msg on nurse vm,to see when exam need to be done pt just have same exam in 3/22/ab". Please advise CT schedulers so they can call the patient to schedule.

## 2020-09-15 NOTE — Telephone Encounter (Signed)
Attempted to contact patient, unable to reach or leave VM at this time. Will send message to our CT department to get CTA scheduled or check on status

## 2020-09-21 NOTE — Telephone Encounter (Signed)
Called patient, LVM to give [place] a call to get scheduled for testing.  Left our number if he needed [place].

## 2020-09-25 NOTE — Telephone Encounter (Signed)
CTA's scheduled on 08/11.  Thanks!

## 2020-09-29 ENCOUNTER — Other Ambulatory Visit: Payer: Self-pay | Admitting: Physician Assistant

## 2020-10-12 ENCOUNTER — Other Ambulatory Visit: Payer: Self-pay

## 2020-10-12 ENCOUNTER — Ambulatory Visit
Admission: RE | Admit: 2020-10-12 | Discharge: 2020-10-12 | Disposition: A | Discharge status: Home / Self Care | Payer: BC Managed Care – PPO | Source: Ambulatory Visit | Attending: Cardiovascular Disease | Admitting: Cardiovascular Disease

## 2020-10-12 DIAGNOSIS — I7123 Aneurysm of the descending thoracic aorta, without rupture: Secondary | ICD-10-CM

## 2020-10-12 DIAGNOSIS — I745 Embolism and thrombosis of iliac artery: Secondary | ICD-10-CM | POA: Diagnosis not present

## 2020-10-12 DIAGNOSIS — I712 Thoracic aortic aneurysm, without rupture, unspecified: Secondary | ICD-10-CM

## 2020-10-12 DIAGNOSIS — I2510 Atherosclerotic heart disease of native coronary artery without angina pectoris: Secondary | ICD-10-CM | POA: Diagnosis not present

## 2020-10-12 DIAGNOSIS — K5730 Diverticulosis of large intestine without perforation or abscess without bleeding: Secondary | ICD-10-CM | POA: Diagnosis not present

## 2020-10-12 DIAGNOSIS — I253 Aneurysm of heart: Secondary | ICD-10-CM | POA: Diagnosis not present

## 2020-10-12 MED ORDER — IOPAMIDOL (ISOVUE-370) INJECTION 76%
75.0000 mL | Freq: Once | INTRAVENOUS | Status: AC | PRN
  Administered 2020-10-12: 75 mL via INTRAVENOUS

## 2020-10-18 ENCOUNTER — Telehealth: Payer: Self-pay | Admitting: Cardiovascular Disease

## 2020-10-18 NOTE — Telephone Encounter (Signed)
Brett Cervantes is calling requesting his CT results.

## 2020-10-18 NOTE — Telephone Encounter (Signed)
Results have been sent to MyChart by MD  Patient called and relayed message  Mr. Shonk:   Your CT scan looks great. The aneurysm is fixed and there are no issues.    Gerri Spore T. Flora Lipps, MD, [facility]  [facility]  [address], [address] [place], [place] [address] [phone]  2:42 PM

## 2021-02-12 DIAGNOSIS — Z5181 Encounter for therapeutic drug level monitoring: Secondary | ICD-10-CM | POA: Diagnosis not present

## 2021-02-12 DIAGNOSIS — Z0000 Encounter for general adult medical examination without abnormal findings: Secondary | ICD-10-CM | POA: Diagnosis not present

## 2021-02-12 DIAGNOSIS — I10 Essential (primary) hypertension: Secondary | ICD-10-CM | POA: Diagnosis not present

## 2021-02-12 DIAGNOSIS — Z125 Encounter for screening for malignant neoplasm of prostate: Secondary | ICD-10-CM | POA: Diagnosis not present

## 2021-02-12 DIAGNOSIS — E785 Hyperlipidemia, unspecified: Secondary | ICD-10-CM | POA: Diagnosis not present

## 2021-02-12 DIAGNOSIS — M1990 Unspecified osteoarthritis, unspecified site: Secondary | ICD-10-CM | POA: Diagnosis not present

## 2021-02-13 DIAGNOSIS — L298 Other pruritus: Secondary | ICD-10-CM | POA: Diagnosis not present

## 2021-02-13 DIAGNOSIS — L821 Other seborrheic keratosis: Secondary | ICD-10-CM | POA: Diagnosis not present

## 2021-02-13 DIAGNOSIS — D225 Melanocytic nevi of trunk: Secondary | ICD-10-CM | POA: Diagnosis not present

## 2021-02-13 DIAGNOSIS — L538 Other specified erythematous conditions: Secondary | ICD-10-CM | POA: Diagnosis not present

## 2021-02-13 DIAGNOSIS — L814 Other melanin hyperpigmentation: Secondary | ICD-10-CM | POA: Diagnosis not present

## 2021-02-13 DIAGNOSIS — L820 Inflamed seborrheic keratosis: Secondary | ICD-10-CM | POA: Diagnosis not present

## 2021-02-13 DIAGNOSIS — L853 Xerosis cutis: Secondary | ICD-10-CM | POA: Diagnosis not present

## 2021-02-23 DIAGNOSIS — M25561 Pain in right knee: Secondary | ICD-10-CM | POA: Diagnosis not present

## 2021-02-23 DIAGNOSIS — M25551 Pain in right hip: Secondary | ICD-10-CM | POA: Diagnosis not present

## 2021-05-10 ENCOUNTER — Encounter: Payer: Self-pay | Admitting: Vascular Surgery

## 2021-05-17 ENCOUNTER — Other Ambulatory Visit: Payer: Self-pay | Admitting: Vascular Surgery

## 2021-05-29 DIAGNOSIS — I10 Essential (primary) hypertension: Secondary | ICD-10-CM | POA: Diagnosis not present

## 2021-05-29 DIAGNOSIS — R200 Anesthesia of skin: Secondary | ICD-10-CM | POA: Diagnosis not present

## 2021-05-29 DIAGNOSIS — Z5181 Encounter for therapeutic drug level monitoring: Secondary | ICD-10-CM | POA: Diagnosis not present

## 2021-05-29 DIAGNOSIS — E785 Hyperlipidemia, unspecified: Secondary | ICD-10-CM | POA: Diagnosis not present

## 2021-05-30 DIAGNOSIS — E785 Hyperlipidemia, unspecified: Secondary | ICD-10-CM | POA: Diagnosis not present

## 2021-05-30 DIAGNOSIS — R200 Anesthesia of skin: Secondary | ICD-10-CM | POA: Diagnosis not present

## 2021-06-01 DIAGNOSIS — E538 Deficiency of other specified B group vitamins: Secondary | ICD-10-CM | POA: Diagnosis not present

## 2021-06-07 DIAGNOSIS — R200 Anesthesia of skin: Secondary | ICD-10-CM | POA: Diagnosis not present

## 2021-06-11 DIAGNOSIS — E538 Deficiency of other specified B group vitamins: Secondary | ICD-10-CM | POA: Diagnosis not present

## 2021-06-19 DIAGNOSIS — E538 Deficiency of other specified B group vitamins: Secondary | ICD-10-CM | POA: Diagnosis not present

## 2021-06-29 DIAGNOSIS — E538 Deficiency of other specified B group vitamins: Secondary | ICD-10-CM | POA: Diagnosis not present

## 2021-06-29 DIAGNOSIS — I10 Essential (primary) hypertension: Secondary | ICD-10-CM | POA: Diagnosis not present

## 2021-07-03 ENCOUNTER — Ambulatory Visit: Payer: BC Managed Care – PPO | Admitting: Vascular Surgery

## 2021-07-12 ENCOUNTER — Other Ambulatory Visit: Payer: Self-pay

## 2021-07-12 DIAGNOSIS — Z8679 Personal history of other diseases of the circulatory system: Secondary | ICD-10-CM

## 2021-07-12 NOTE — Addendum Note (Signed)
Addended by: Yolonda Kida on: 07/12/2021 12:48 PM ? ? Modules accepted: Orders ? ?

## 2021-07-18 ENCOUNTER — Other Ambulatory Visit: Payer: Self-pay

## 2021-07-18 DIAGNOSIS — Z95828 Presence of other vascular implants and grafts: Secondary | ICD-10-CM

## 2021-07-18 DIAGNOSIS — I712 Thoracic aortic aneurysm, without rupture, unspecified: Secondary | ICD-10-CM

## 2021-07-24 ENCOUNTER — Ambulatory Visit: Payer: BC Managed Care – PPO | Admitting: Vascular Surgery

## 2021-08-06 DIAGNOSIS — I2510 Atherosclerotic heart disease of native coronary artery without angina pectoris: Secondary | ICD-10-CM | POA: Diagnosis not present

## 2021-08-06 DIAGNOSIS — I7123 Aneurysm of the descending thoracic aorta, without rupture: Secondary | ICD-10-CM | POA: Diagnosis not present

## 2021-08-06 DIAGNOSIS — Z9889 Other specified postprocedural states: Secondary | ICD-10-CM | POA: Diagnosis not present

## 2021-08-06 DIAGNOSIS — K5730 Diverticulosis of large intestine without perforation or abscess without bleeding: Secondary | ICD-10-CM | POA: Diagnosis not present

## 2021-08-06 DIAGNOSIS — I712 Thoracic aortic aneurysm, without rupture, unspecified: Secondary | ICD-10-CM | POA: Diagnosis not present

## 2021-08-06 DIAGNOSIS — I7121 Aneurysm of the ascending aorta, without rupture: Secondary | ICD-10-CM | POA: Diagnosis not present

## 2021-08-11 ENCOUNTER — Other Ambulatory Visit: Payer: Self-pay | Admitting: Cardiovascular Disease

## 2021-08-20 NOTE — Progress Notes (Deleted)
[facility] OF [place] PROGRESS NOTE  ASSESSMENT / PLAN: Brett Cervantes is a 61 y.o. male status post Zone 2 TEVAR, left carotid-subclavian bypass 04/24/20 for 70mm descending thoracic aortic aneurysm. Follow up CTA shows good technical result - no evidence of endoleak, bypass widely patent. Clinically he looks excellent. I cleared him for activity as tolerated. Will plan on surveillance with me annually with CTA.  SUBJECTIVE: No complaints. Looks and feels great.  OBJECTIVE: There were no vitals taken for this visit.  Left collar incision well healed 2+ L radial pulse     Latest Ref Rng & Units 04/25/2020    5:17 AM 04/25/2020   12:43 AM 04/24/2020    1:10 PM  CBC  WBC 4.0 - 10.5 K/uL 13.6  11.3  10.4   Hemoglobin 13.0 - 17.0 g/dL 00.6  34.9  49.4   Hematocrit 39.0 - 52.0 % 36.2  37.5  39.5   Platelets 150 - 400 K/uL 110  105  133         Latest Ref Rng & Units 04/25/2020    5:17 AM 04/25/2020   12:43 AM 04/24/2020    1:10 PM  CMP  Glucose 70 - 99 mg/dL 473  958  441   BUN 6 - 20 mg/dL 21  22  24    Creatinine 0.61 - 1.24 mg/dL  7.12  7.87   Sodium 135 - 145 mmol/L 137  138  138   Potassium 3.5 - 5.1 mmol/L 4.0  4.1  4.1   Chloride 98 - 111 mmol/L 105  104  105   CO2 22 - 32 mmol/L 22  24  22    Calcium 8.9 - 10.3 mg/dL 8.3  8.5  8.3    CTA reviewed. No endoleak. Bypass patent. Remainder of aorta healthy.   1.83. , MD [facility] [place] [facility] Phone Number: [phone] 08/20/2021 5:54 PM

## 2021-08-21 ENCOUNTER — Ambulatory Visit: Payer: BC Managed Care – PPO | Admitting: Vascular Surgery

## 2021-09-10 NOTE — Progress Notes (Unsigned)
[facility] OF [place] PROGRESS NOTE  ASSESSMENT / PLAN: Brett Cervantes is a 61 y.o. male status post Zone 2 TEVAR, left carotid-subclavian bypass 04/24/20 for 30mm descending thoracic aortic aneurysm. Follow up CTA shows good technical result - no evidence of endoleak, bypass widely patent. Clinically he looks excellent. I cleared him for activity as tolerated. Will plan on surveillance with me annually with CTA.  SUBJECTIVE: No complaints. Looks and feels great.  OBJECTIVE: There were no vitals taken for this visit.  Left collar incision well healed 2+ L radial pulse     Latest Ref Rng & Units 04/25/2020    5:17 AM 04/25/2020   12:43 AM 04/24/2020    1:10 PM  CBC  WBC 4.0 - 10.5 K/uL 13.6  11.3  10.4   Hemoglobin 13.0 - 17.0 g/dL 62.9  52.8  41.3   Hematocrit 39.0 - 52.0 % 36.2  37.5  39.5   Platelets 150 - 400 K/uL 110  105  133         Latest Ref Rng & Units 04/25/2020    5:17 AM 04/25/2020   12:43 AM 04/24/2020    1:10 PM  CMP  Glucose 70 - 99 mg/dL 244  010  272   BUN 6 - 20 mg/dL 21  22  24    Creatinine 0.61 - 1.24 mg/dL  5.36  6.44   Sodium 135 - 145 mmol/L 137  138  138   Potassium 3.5 - 5.1 mmol/L 4.0  4.1  4.1   Chloride 98 - 111 mmol/L 105  104  105   CO2 22 - 32 mmol/L 22  24  22    Calcium 8.9 - 10.3 mg/dL 8.3  8.5  8.3    CTA reviewed. No endoleak. Bypass patent. Remainder of aorta healthy.   0.34. , MD [facility] Phone Number: [phone] 09/10/2021 3:21 PM

## 2021-09-11 ENCOUNTER — Encounter: Payer: Self-pay | Admitting: Vascular Surgery

## 2021-09-11 ENCOUNTER — Ambulatory Visit: Payer: BC Managed Care – PPO | Admitting: Vascular Surgery

## 2021-09-11 VITALS — BP 126/72 | HR 62 | Temp 98.2°F | Resp 20 | Ht 71.0 in | Wt 188.0 lb

## 2021-09-11 DIAGNOSIS — Z95828 Presence of other vascular implants and grafts: Secondary | ICD-10-CM

## 2021-10-17 DIAGNOSIS — I2510 Atherosclerotic heart disease of native coronary artery without angina pectoris: Secondary | ICD-10-CM | POA: Diagnosis not present

## 2021-10-17 DIAGNOSIS — I10 Essential (primary) hypertension: Secondary | ICD-10-CM | POA: Diagnosis not present

## 2021-10-17 DIAGNOSIS — E785 Hyperlipidemia, unspecified: Secondary | ICD-10-CM | POA: Diagnosis not present

## 2021-10-17 DIAGNOSIS — Z23 Encounter for immunization: Secondary | ICD-10-CM | POA: Diagnosis not present

## 2021-10-17 DIAGNOSIS — E538 Deficiency of other specified B group vitamins: Secondary | ICD-10-CM | POA: Diagnosis not present

## 2021-10-17 DIAGNOSIS — I7123 Aneurysm of the descending thoracic aorta, without rupture: Secondary | ICD-10-CM | POA: Diagnosis not present

## 2021-10-26 DIAGNOSIS — E538 Deficiency of other specified B group vitamins: Secondary | ICD-10-CM | POA: Diagnosis not present

## 2021-11-02 DIAGNOSIS — E538 Deficiency of other specified B group vitamins: Secondary | ICD-10-CM | POA: Diagnosis not present

## 2021-11-07 ENCOUNTER — Ambulatory Visit: Payer: BC Managed Care – PPO | Attending: Physician Assistant | Admitting: Nurse Practitioner

## 2021-11-07 ENCOUNTER — Encounter: Payer: Self-pay | Admitting: Nurse Practitioner

## 2021-11-07 VITALS — BP 131/71 | HR 61 | Ht 71.0 in | Wt 187.2 lb

## 2021-11-07 DIAGNOSIS — I7123 Aneurysm of the descending thoracic aorta, without rupture: Secondary | ICD-10-CM | POA: Diagnosis not present

## 2021-11-07 DIAGNOSIS — E782 Mixed hyperlipidemia: Secondary | ICD-10-CM

## 2021-11-07 DIAGNOSIS — R0789 Other chest pain: Secondary | ICD-10-CM | POA: Diagnosis not present

## 2021-11-07 DIAGNOSIS — E781 Pure hyperglyceridemia: Secondary | ICD-10-CM

## 2021-11-07 DIAGNOSIS — I2510 Atherosclerotic heart disease of native coronary artery without angina pectoris: Secondary | ICD-10-CM | POA: Diagnosis not present

## 2021-11-07 DIAGNOSIS — I10 Essential (primary) hypertension: Secondary | ICD-10-CM

## 2021-11-07 MED ORDER — FENOFIBRATE 160 MG PO TABS: 160.0000 mg | ORAL_TABLET | Freq: Every day | ORAL | 3 refills | Status: DC

## 2021-11-07 NOTE — Patient Instructions (Signed)
Medication Instructions:  START Fenofibrate 160 mg daily  *If you need a refill on your cardiac medications before your next appointment, please call your pharmacy*  Lab Work: Sharlene Dory, NP recommends that you have lab work completed at your next PCP appointment   Your physician recommends that you return for lab work in 2 months:  Fasting Lipid Panel-DO NOT EAT or DRINK past midnight. Okay to have water to drink Hepatic (Liver) Function Test   If you have labs (blood work) drawn today and your tests are completely normal, you will receive your results only by: MyChart Message (if you have MyChart) OR A paper copy in the mail If you have any lab test that is abnormal or we need to change your treatment, we will call you to review the results.  Testing/Procedures: NONE ordered at this time of appointment   Follow-Up: At [facility], you and your health needs are our priority.  As part of our continuing mission to provide you with exceptional heart care, we have created designated Provider Care Teams.  These Care Teams include your primary Cardiologist (physician) and Advanced Practice Providers (APPs -  Physician Assistants and Nurse Practitioners) who all work together to provide you with the care you need, when you need it.  Your next appointment:   6 month(s)  The format for your next appointment:   In Person  Provider:   Reatha Harps, MD     Other Instructions  Important Information About Sugar

## 2021-11-07 NOTE — Progress Notes (Signed)
Cardiology Office Note:    Date:  11/07/2021   ID:  Brett Cervantes, DOB 13-Mar-1960, MRN [id number]  PCP:  Ileana Ladd, MD (Inactive)   [facility] Providers Cardiologist:  Reatha Harps, MD     Referring MD: No ref. provider found   Chief Complaint  Patient presents with   Follow-up    History of Present Illness:    Brett Cervantes is a 61 y.o. male with a hx of the following:   Coronary artery calcification of CT scan HTN Descending thoracic aortic aneurysm, s/p Zone 2 TEVAR, left carotid-subclavian bypass 04/24/20 for 7.8 cm descending thoracic aortic aneurysm.   Patient has a history of hypertension, hyperlipidemia, descending thoracic aortic aneurysm, coronary calcium that presented for follow-up with Dr. Flora Lipps on Jul 24 2020.  While undergoing cardiac CT, an incidental finding of a descending thoracic aortic aneurysm was noted.  Was very large and measured 7.8 cm  He was admitted to the hospital and underwent TEE EVAR, left common carotid artery and left subclavian bypass.  A repeat CT of the chest revealed stable repair.  At the last visit in 2022 with Dr. Flora Lipps he was reporting doing very well from a cardiac perspective-he had been released by restrictions by his vascular surgeon, blood pressure stable denies smoking or ever smoking.  Denied any family history of aneurysms.  He stated back pain was improving.  His CT scan in August 2022 was normal, no issues with his aneurysm that was fixed.  He is followed by vascular and vein specialist in [place].  Last office visit on September 11, 2021-his aneurysm sac measured 71 mm in the greatest dimension-this is being annually surveyed by this team.  He denied any complaints at that visit.  Today he presents for follow-up.  Overall he has been doing well since his last cardiology office visit.  Does state that he noticed since last summer he has some soreness in his chest since his surgery, notices this when doing yard  work and trimming shrubs, he wonders if he had pulled a muscle or if this is a musculoskeletal issue.  Denies any chest pain.  Denies any shortness of breath, dyspnea, fatigue, swelling or significant weight changes, syncope, presyncope, dizziness, orthopnea, PND, or claudication.  Past Medical History:  Diagnosis Date   Hypertension     Past Surgical History:  Procedure Laterality Date   ANEURYSM COILING Left 04/24/2020   Procedure: LEFT SUBCLAVIAN ARTERY COILING;  Surgeon: Leonie Douglas, MD;  Location: [facility] OR;  Service: Vascular;  Laterality: Left;   CAROTID-SUBCLAVIAN BYPASS GRAFT Left 04/24/2020   Procedure: LEFT CAROTID-SUBCLAVIAN BYPASS GRAFT USING 68mm HEMASHIELD GRAFT;  Surgeon: Leonie Douglas, MD;  Location: [facility] OR;  Service: Vascular;  Laterality: Left;   THORACIC AORTIC ENDOVASCULAR STENT GRAFT N/A 04/24/2020   Procedure: THORACIC AORTIC ENDOVASCULAR STENT GRAFT;  Surgeon: Leonie Douglas, MD;  Location: [facility] OR;  Service: Vascular;  Laterality: N/A;   ULTRASOUND GUIDANCE FOR VASCULAR ACCESS N/A 04/24/2020   Procedure: ULTRASOUND GUIDANCE FOR VASCULAR ACCESS;  Surgeon: Leonie Douglas, MD;  Location: [facility] OR;  Service: Vascular;  Laterality: N/A;    Current Medications: Current Meds  Medication Sig   amLODipine (NORVASC) 5 MG tablet Take 5 mg by mouth daily.   aspirin EC 81 MG EC tablet Take 1 tablet (81 mg total) by mouth daily at 6 (six) AM. Swallow whole.   Cyanocobalamin (B-12 COMPLIANCE INJECTION IJ) Inject as directed.   metoprolol  succinate (TOPROL-XL) 25 MG 24 hr tablet TAKE ONE TABLET BY MOUTH DAILY   rosuvastatin (CRESTOR) 20 MG tablet Take by mouth.     Allergies:   Atorvastatin calcium and Ibuprofen   Social History   Socioeconomic History   Marital status: Married    Spouse name: Not on file   Number of children: Not on file   Years of education: Not on file   Highest education level: Not on file  Occupational History   Occupation: Holiday representative   Tobacco  Use   Smoking status: Never   Smokeless tobacco: Never  Vaping Use   Vaping Use: Never used  Substance and Sexual Activity   Alcohol use: Yes    Alcohol/week: 2.0 standard drinks of alcohol    Types: 2 Cans of beer per week   Drug use: No   Sexual activity: Not on file  Other Topics Concern   Not on file  Social History Narrative   Not on file   Social Determinants of Health   Financial Resource Strain: Not on file  Food Insecurity: Not on file  Transportation Needs: Not on file  Physical Activity: Not on file  Stress: Not on file  Social Connections: Not on file     Family History: The patient's family history includes Heart attack in his father.  ROS:   Review of Systems  Constitutional: Negative.   HENT: Negative.    Eyes: Negative.   Respiratory: Negative.    Cardiovascular:  Negative for chest pain, palpitations, orthopnea, claudication, leg swelling and PND.       See HPI.  Gastrointestinal: Negative.   Genitourinary: Negative.   Musculoskeletal: Negative.   Skin: Negative.   Neurological: Negative.   Endo/Heme/Allergies: Negative.   Psychiatric/Behavioral: Negative.      Please see the history of present illness.    All other systems reviewed and are negative.  EKGs/Labs/Other Studies Reviewed:    The following studies were reviewed today:   EKG:  EKG is ordered today.  The ekg ordered today demonstrates NSR, 61 bpm, no acute changes.    Recent Labs: No results found for requested labs within last 365 days.  Recent Lipid Panel    Component Value Date/Time   CHOL 151 04/23/2020 0200   TRIG 81 04/23/2020 0200   HDL 39 (L) 04/23/2020 0200   CHOLHDL 3.9 04/23/2020 0200   VLDL 16 04/23/2020 0200   LDLCALC 96 04/23/2020 0200        Physical Exam:    VS:  BP 131/71   Pulse 61   Ht 5\' 11"  (1.803 m)   Wt 187 lb 3.2 oz (84.9 kg)   SpO2 98%   BMI 26.11 kg/m     Wt Readings from Last 3 Encounters:  11/07/21 187 lb 3.2 oz (84.9 kg)  09/11/21  188 lb (85.3 kg)  07/24/20 180 lb 9.6 oz (81.9 kg)     GEN: Well nourished, well developed in no acute distress HEENT: Normal NECK: No JVD; No carotid bruits CARDIAC: RRR, no murmurs, rubs, gallops; 2+ peripheral pulses throughout, strong and equal bilaterally RESPIRATORY:  Clear to auscultation without rales, wheezing or rhonchi  ABDOMEN: Soft, non-tender, non-distended MUSCULOSKELETAL:  No edema; No deformity  SKIN: Warm and dry NEUROLOGIC:  Alert and oriented x 3 PSYCHIATRIC:  Normal affect   ASSESSMENT:    1. Chest wall tenderness   2. Aneurysm of descending thoracic aorta without rupture (HCC)   3. Coronary artery calcification seen on CT scan  4. Mixed hyperlipidemia   5. Hypertriglyceridemia, essential   6. Hypertension, unspecified type    PLAN:    In order of problems listed above:  Chest wall tenderness  Etiology multifactorial.  Has had this pain chronically that is noticed with doing yard work or trimming shrubs.  Discussed further ischemic evaluations options including noninvasive testing.  States he is not that bothered by it and would like to defer ischemic evaluation at this time.  Discussed ED precautions and he verbalizes understanding.      2.  Aneurysm of descending thoracic aorta without rupture, status post TEVAR, chronic stable CT of the chest in August 2022 revealed stable patency and positioning of thoracic endograft with further reduction in thrombosed aneurysm sac size at the level of the distal arch and descending thoracic aorta, no evidence of endoleak or complication, left common carotid to subclavian artery bypass graft was normally patent.  Consider repeat CT in 6 months.   3.  Coronary artery calcification, hyperlipidemia, hypertriglyceridemia - chronic Coronary calcium score was 182, 79 percentile for his demographic as seen on CT in February 2022.  Last LDL was at goal, however triglycerides were elevated and not at goal.  Initiate fenofibrate  160 mg daily and continue Crestor 20 mg tablet as scheduled (currently being managed by PCP).  Plan to repeat fasting lipid panel and liver enzymes in 2 months.  He defers further ischemic evaluation at this time as mentioned above.  We will continue to monitor.  4. HTN - chronic, stable Blood pressure today 131/71.  Discussed that systolic blood pressure goal is less than 130. Discussed to monitor BP at home at least 2 hours after medications and sitting for 5-10 minutes. Continue current medication regimen. Discussed if SBP are consistently > 130 to let us know and will titrate and adjust his blood pressure medications.   5. Disposition: Follow-up with Dr. Flora Lipps in 6 months or sooner if needed.    Medication Adjustments/Labs and Tests Ordered: Current medicines are reviewed at length with the patient today.  Concerns regarding medicines are outlined above.  Orders Placed This Encounter  Procedures   Lipid panel   Hepatic function panel   EKG 12-Lead   Meds ordered this encounter  Medications   fenofibrate 160 MG tablet    Sig: Take 1 tablet (160 mg total) by mouth daily.    Dispense:  90 tablet    Refill:  3    Patient Instructions  Medication Instructions:  START Fenofibrate 160 mg daily  *If you need a refill on your cardiac medications before your next appointment, please call your pharmacy*  Lab Work: Sharlene Dory, NP recommends that you have lab work completed at your next PCP appointment   Your physician recommends that you return for lab work in 2 months:  Fasting Lipid Panel-DO NOT EAT or DRINK past midnight. Okay to have water to drink Hepatic (Liver) Function Test   If you have labs (blood work) drawn today and your tests are completely normal, you will receive your results only by: MyChart Message (if you have MyChart) OR A paper copy in the mail If you have any lab test that is abnormal or we need to change your treatment, we will call you to review the  results.  Testing/Procedures: NONE ordered at this time of appointment   Follow-Up: At [facility], you and your health needs are our priority.  As part of our continuing mission to provide you with exceptional heart  care, we have created designated Provider Care Teams.  These Care Teams include your primary Cardiologist (physician) and Advanced Practice Providers (APPs -  Physician Assistants and Nurse Practitioners) who all work together to provide you with the care you need, when you need it.  Your next appointment:   6 month(s)  The format for your next appointment:   In Person  Provider:   Reatha Harps, MD     Other Instructions  Important Information About Sugar         Signed, Sharlene Dory, NP  11/07/2021 9:00 PM    [facility]

## 2021-11-09 DIAGNOSIS — E538 Deficiency of other specified B group vitamins: Secondary | ICD-10-CM | POA: Diagnosis not present

## 2021-11-23 DIAGNOSIS — E538 Deficiency of other specified B group vitamins: Secondary | ICD-10-CM | POA: Diagnosis not present

## 2021-11-30 DIAGNOSIS — E538 Deficiency of other specified B group vitamins: Secondary | ICD-10-CM | POA: Diagnosis not present

## 2021-12-07 DIAGNOSIS — E538 Deficiency of other specified B group vitamins: Secondary | ICD-10-CM | POA: Diagnosis not present

## 2021-12-14 DIAGNOSIS — E538 Deficiency of other specified B group vitamins: Secondary | ICD-10-CM | POA: Diagnosis not present

## 2021-12-21 DIAGNOSIS — E538 Deficiency of other specified B group vitamins: Secondary | ICD-10-CM | POA: Diagnosis not present

## 2021-12-28 DIAGNOSIS — E538 Deficiency of other specified B group vitamins: Secondary | ICD-10-CM | POA: Diagnosis not present

## 2022-02-13 DIAGNOSIS — L814 Other melanin hyperpigmentation: Secondary | ICD-10-CM | POA: Diagnosis not present

## 2022-02-13 DIAGNOSIS — L570 Actinic keratosis: Secondary | ICD-10-CM | POA: Diagnosis not present

## 2022-02-13 DIAGNOSIS — L723 Sebaceous cyst: Secondary | ICD-10-CM | POA: Diagnosis not present

## 2022-02-13 DIAGNOSIS — L821 Other seborrheic keratosis: Secondary | ICD-10-CM | POA: Diagnosis not present

## 2022-02-13 DIAGNOSIS — D225 Melanocytic nevi of trunk: Secondary | ICD-10-CM | POA: Diagnosis not present

## 2022-02-13 DIAGNOSIS — D492 Neoplasm of unspecified behavior of bone, soft tissue, and skin: Secondary | ICD-10-CM | POA: Diagnosis not present

## 2022-03-25 DIAGNOSIS — R051 Acute cough: Secondary | ICD-10-CM | POA: Diagnosis not present

## 2022-03-25 DIAGNOSIS — Z03818 Encounter for observation for suspected exposure to other biological agents ruled out: Secondary | ICD-10-CM | POA: Diagnosis not present

## 2022-04-23 DIAGNOSIS — E785 Hyperlipidemia, unspecified: Secondary | ICD-10-CM | POA: Diagnosis not present

## 2022-04-23 DIAGNOSIS — E538 Deficiency of other specified B group vitamins: Secondary | ICD-10-CM | POA: Diagnosis not present

## 2022-04-29 DIAGNOSIS — E785 Hyperlipidemia, unspecified: Secondary | ICD-10-CM | POA: Diagnosis not present

## 2022-04-29 DIAGNOSIS — M549 Dorsalgia, unspecified: Secondary | ICD-10-CM | POA: Diagnosis not present

## 2022-04-29 DIAGNOSIS — I10 Essential (primary) hypertension: Secondary | ICD-10-CM | POA: Diagnosis not present

## 2022-04-29 DIAGNOSIS — I2510 Atherosclerotic heart disease of native coronary artery without angina pectoris: Secondary | ICD-10-CM | POA: Diagnosis not present

## 2022-05-09 NOTE — Progress Notes (Signed)
Cardiology Office Note:   Date:  05/10/2022  NAME:  Brett Cervantes    MRN: [id number] DOB:  [birth year]   PCP:  Cari Caraway, MD  Cardiologist:  Evalina Field, MD  Electrophysiologist:  None   Referring MD: No ref. provider found   Chief Complaint  Patient presents with   Follow-up        History of Present Illness:   Brett Cervantes is a 62 y.o. male with a hx of descending aortic aneurysm s/p endovascular repair who presents for follow-up.  Well.  Continues to do well after descending thoracic aortic aneurysm repair.  At work.  No chest pain or trouble breathing.  BP 108/60.  LDL 60.  Denies any symptoms in office.  CV exam normal.  Not diabetic.  Working on his diet.  Overall doing quite well without complaints in office today.  Problem List 1. Descending Thoracic Aortic Aneurysm (7.8 cm) -S/p TEVAR with coverage of left subclavian  -Left common carotid artery/Left subclavian bypass 2. HTN 3. HLD -T chol 123, HDL 45, LDL 60, TG 95 4. CAD -Calcium score 182 (79th percentile) -25-49% pLAD  Past Medical History: Past Medical History:  Diagnosis Date   Hypertension     Past Surgical History: Past Surgical History:  Procedure Laterality Date   ANEURYSM COILING Left 04/24/2020   Procedure: LEFT SUBCLAVIAN ARTERY COILING;  Surgeon: Cherre Robins, MD;  Location: [place];  Service: Vascular;  Laterality: Left;   CAROTID-SUBCLAVIAN BYPASS GRAFT Left 04/24/2020   Procedure: LEFT CAROTID-SUBCLAVIAN BYPASS GRAFT USING 94mm HEMASHIELD GRAFT;  Surgeon: Cherre Robins, MD;  Location: [facility] OR;  Service: Vascular;  Laterality: Left;   THORACIC AORTIC ENDOVASCULAR STENT GRAFT N/A 04/24/2020   Procedure: THORACIC AORTIC ENDOVASCULAR STENT GRAFT;  Surgeon: Cherre Robins, MD;  Location: [place];  Service: Vascular;  Laterality: N/A;   ULTRASOUND GUIDANCE FOR VASCULAR ACCESS N/A 04/24/2020   Procedure: ULTRASOUND GUIDANCE FOR VASCULAR ACCESS;  Surgeon: Cherre Robins, MD;  Location: [facility]  OR;  Service: Vascular;  Laterality: N/A;    Current Medications: Current Meds  Medication Sig   amLODipine (NORVASC) 5 MG tablet Take 5 mg by mouth daily.   aspirin EC 81 MG EC tablet Take 1 tablet (81 mg total) by mouth daily at 6 (six) AM. Swallow whole.   Cyanocobalamin (B-12 PO) Take by mouth.   metoprolol succinate (TOPROL-XL) 25 MG 24 hr tablet TAKE ONE TABLET BY MOUTH DAILY   rosuvastatin (CRESTOR) 20 MG tablet Take by mouth.   [DISCONTINUED] Cyanocobalamin (B-12 COMPLIANCE INJECTION IJ) Inject as directed.     Allergies:    Atorvastatin calcium and Ibuprofen   Social History: Social History   Socioeconomic History   Marital status: Married    Spouse name: Not on file   Number of children: 0   Years of education: Not on file   Highest education level: Not on file  Occupational History   Occupation: Architect   Tobacco Use   Smoking status: Never   Smokeless tobacco: Never  Vaping Use   Vaping Use: Never used  Substance and Sexual Activity   Alcohol use: Yes    Alcohol/week: 2.0 standard drinks of alcohol    Types: 2 Cans of beer per week   Drug use: No   Sexual activity: Not on file  Other Topics Concern   Not on file  Social History Narrative   Not on file   Social Determinants of Health   Financial  Resource Strain: Not on file  Food Insecurity: Not on file  Transportation Needs: Not on file  Physical Activity: Not on file  Stress: Not on file  Social Connections: Not on file     Family History: The patient's family history includes Heart attack in his father.  ROS:   All other ROS reviewed and negative. Pertinent positives noted in the HPI.     EKGs/Labs/Other Studies Reviewed:   The following studies were personally reviewed by me today:  Recent Labs: No results found for requested labs within last 365 days.   Recent Lipid Panel    Component Value Date/Time   CHOL 151 04/23/2020 0200   TRIG 81 04/23/2020 0200   HDL 39 (L) 04/23/2020  0200   CHOLHDL 3.9 04/23/2020 0200   VLDL 16 04/23/2020 0200   LDLCALC 96 04/23/2020 0200    Physical Exam:   VS:  BP 108/60   Pulse 62   Ht 5\' 11"  (1.803 m)   Wt 193 lb (87.5 kg)   SpO2 98%   BMI 26.92 kg/m    Wt Readings from Last 3 Encounters:  05/10/22 193 lb (87.5 kg)  11/07/21 187 lb 3.2 oz (84.9 kg)  09/11/21 188 lb (85.3 kg)    General: Well nourished, well developed, in no acute distress Head: Atraumatic, normal size  Eyes: PEERLA, EOMI  Neck: Supple, no JVD Endocrine: No thryomegaly Cardiac: Normal S1, S2; RRR; no murmurs, rubs, or gallops Lungs: Clear to auscultation bilaterally, no wheezing, rhonchi or rales  Abd: Soft, nontender, no hepatomegaly  Ext: No edema, pulses 2+ Musculoskeletal: No deformities, BUE and BLE strength normal and equal Skin: Warm and dry, no rashes   Neuro: Alert and oriented to person, place, time, and situation, CNII-XII grossly intact, no focal deficits  Psych: Normal mood and affect   ASSESSMENT:   Brett Cervantes is a 62 y.o. male who presents for the following: 1. Aneurysm of descending thoracic aorta without rupture (HCC)   2. Coronary artery calcification seen on CT scan   3. Mixed hyperlipidemia     PLAN:   1. Aneurysm of descending thoracic aorta without rupture (HCC) -Status post TEVAR of the descending thoracic aortic aneurysm.  With left common carotid artery and left subclavian bypass.  Stable on yearly CT.  Follows with vascular surgery.  Currently on aspirin and statin therapy.  BP is at goal.  He is on metoprolol succinate 25 mg daily as well as amlodipine.  Will continue this.  Goal BP less than 120/80.  2. Coronary artery calcification seen on CT scan 3. Mixed hyperlipidemia -Elevated coronary calcium score.  On aspirin.  On statin.  LDL 60.  This is at goal.  He will see [place] yearly.     Disposition: Return in about 1 year (around 05/10/2023).  Medication Adjustments/Labs and Tests Ordered: Current medicines are  reviewed at length with the patient today.  Concerns regarding medicines are outlined above.  No orders of the defined types were placed in this encounter.  No orders of the defined types were placed in this encounter.   Patient Instructions  Medication Instructions:  The current medical regimen is effective;  continue present plan and medications.  *If you need a refill on your cardiac medications before your next appointment, please call your pharmacy*   Follow-Up: At [facility], you and your health needs are our priority.  As part of our continuing mission to provide you with exceptional heart care, we have  created designated Provider Care Teams.  These Care Teams include your primary Cardiologist (physician) and Advanced Practice Providers (APPs -  Physician Assistants and Nurse Practitioners) who all work together to provide you with the care you need, when you need it.  We recommend signing up for the patient portal called "MyChart".  Sign up information is provided on this After Visit Summary.  MyChart is used to connect with patients for Virtual Visits (Telemedicine).  Patients are able to view lab/test results, encounter notes, upcoming appointments, etc.  Non-urgent messages can be sent to your provider as well.   To learn more about what you can do with MyChart, go to [email or web address].    Your next appointment:   12 month(s)  Provider:   Evalina Field, MD     Time Spent with Patient: I have spent a total of 25 minutes with patient reviewing hospital notes, telemetry, EKGs, labs and examining the patient as well as establishing an assessment and plan that was discussed with the patient.  > 50% of time was spent in direct patient care.  Signed, Addison Naegeli. Audie Box, MD, [place]  [address], [place], [place] [address] [phone]  05/10/2022 9:18 AM

## 2022-05-10 ENCOUNTER — Encounter: Payer: Self-pay | Admitting: Cardiovascular Disease

## 2022-05-10 ENCOUNTER — Ambulatory Visit: Payer: BC Managed Care – PPO | Attending: Cardiovascular Disease | Admitting: Cardiovascular Disease

## 2022-05-10 VITALS — BP 108/60 | HR 62 | Ht 71.0 in | Wt 193.0 lb

## 2022-05-10 DIAGNOSIS — I2510 Atherosclerotic heart disease of native coronary artery without angina pectoris: Secondary | ICD-10-CM | POA: Diagnosis not present

## 2022-05-10 DIAGNOSIS — E782 Mixed hyperlipidemia: Secondary | ICD-10-CM

## 2022-05-10 DIAGNOSIS — I7123 Aneurysm of the descending thoracic aorta, without rupture: Secondary | ICD-10-CM

## 2022-05-10 NOTE — Patient Instructions (Signed)
Medication Instructions:  The current medical regimen is effective;  continue present plan and medications.  *If you need a refill on your cardiac medications before your next appointment, please call your pharmacy*   Follow-Up: At [facility], you and your health needs are our priority.  As part of our continuing mission to provide you with exceptional heart care, we have created designated Provider Care Teams.  These Care Teams include your primary Cardiologist (physician) and Advanced Practice Providers (APPs -  Physician Assistants and Nurse Practitioners) who all work together to provide you with the care you need, when you need it.  We recommend signing up for the patient portal called "MyChart".  Sign up information is provided on this After Visit Summary.  MyChart is used to connect with patients for Virtual Visits (Telemedicine).  Patients are able to view lab/test results, encounter notes, upcoming appointments, etc.  Non-urgent messages can be sent to your provider as well.   To learn more about what you can do with MyChart, go to [email or web address].    Your next appointment:   12 month(s)  Provider:   [place] T O'Neal, MD   

## 2022-05-16 DIAGNOSIS — M549 Dorsalgia, unspecified: Secondary | ICD-10-CM | POA: Diagnosis not present

## 2022-05-30 DIAGNOSIS — M546 Pain in thoracic spine: Secondary | ICD-10-CM | POA: Diagnosis not present

## 2022-06-05 DIAGNOSIS — M549 Dorsalgia, unspecified: Secondary | ICD-10-CM | POA: Diagnosis not present

## 2022-06-10 DIAGNOSIS — M549 Dorsalgia, unspecified: Secondary | ICD-10-CM | POA: Diagnosis not present

## 2022-06-17 DIAGNOSIS — M549 Dorsalgia, unspecified: Secondary | ICD-10-CM | POA: Diagnosis not present

## 2022-10-09 ENCOUNTER — Other Ambulatory Visit: Payer: Self-pay

## 2022-10-09 ENCOUNTER — Emergency Department (HOSPITAL_BASED_OUTPATIENT_CLINIC_OR_DEPARTMENT_OTHER)
Admission: EM | Admit: 2022-10-09 | Discharge: 2022-10-09 | Disposition: A | Discharge status: Home / Self Care | Payer: BC Managed Care – PPO | Attending: Emergency Medicine | Admitting: Emergency Medicine

## 2022-10-09 ENCOUNTER — Emergency Department (HOSPITAL_BASED_OUTPATIENT_CLINIC_OR_DEPARTMENT_OTHER): Payer: BC Managed Care – PPO

## 2022-10-09 ENCOUNTER — Encounter (HOSPITAL_BASED_OUTPATIENT_CLINIC_OR_DEPARTMENT_OTHER): Payer: Self-pay | Admitting: Emergency Medicine

## 2022-10-09 DIAGNOSIS — K5730 Diverticulosis of large intestine without perforation or abscess without bleeding: Secondary | ICD-10-CM | POA: Diagnosis not present

## 2022-10-09 DIAGNOSIS — Z7982 Long term (current) use of aspirin: Secondary | ICD-10-CM | POA: Diagnosis not present

## 2022-10-09 DIAGNOSIS — N281 Cyst of kidney, acquired: Secondary | ICD-10-CM | POA: Diagnosis not present

## 2022-10-09 DIAGNOSIS — N400 Enlarged prostate without lower urinary tract symptoms: Secondary | ICD-10-CM | POA: Diagnosis not present

## 2022-10-09 DIAGNOSIS — Z79899 Other long term (current) drug therapy: Secondary | ICD-10-CM | POA: Insufficient documentation

## 2022-10-09 DIAGNOSIS — N132 Hydronephrosis with renal and ureteral calculous obstruction: Secondary | ICD-10-CM | POA: Diagnosis not present

## 2022-10-09 DIAGNOSIS — I10 Essential (primary) hypertension: Secondary | ICD-10-CM | POA: Diagnosis not present

## 2022-10-09 DIAGNOSIS — K7689 Other specified diseases of liver: Secondary | ICD-10-CM | POA: Diagnosis not present

## 2022-10-09 DIAGNOSIS — N200 Calculus of kidney: Secondary | ICD-10-CM | POA: Diagnosis not present

## 2022-10-09 DIAGNOSIS — R739 Hyperglycemia, unspecified: Secondary | ICD-10-CM | POA: Insufficient documentation

## 2022-10-09 DIAGNOSIS — R1031 Right lower quadrant pain: Secondary | ICD-10-CM | POA: Diagnosis not present

## 2022-10-09 LAB — URINALYSIS, ROUTINE W REFLEX MICROSCOPIC
Bacteria, UA: NONE SEEN
Bilirubin Urine: NEGATIVE
Glucose, UA: NEGATIVE mg/dL
Ketones, ur: NEGATIVE mg/dL
Leukocytes,Ua: NEGATIVE
Nitrite: NEGATIVE
Protein, ur: 30 mg/dL — AB
Specific Gravity, Urine: >1.046 — ABNORMAL HIGH (ref 1.005–1.030)
pH: 5.5 (ref 5.0–8.0)

## 2022-10-09 LAB — CBC
HCT: 42.2 % (ref 39.0–52.0)
Hemoglobin: 14.7 g/dL (ref 13.0–17.0)
MCH: 33 pg (ref 26.0–34.0)
MCHC: 34.8 g/dL (ref 30.0–36.0)
MCV: 94.6 fL (ref 80.0–100.0)
Platelets: 144 10*3/uL — ABNORMAL LOW (ref 150–400)
RBC: 4.46 MIL/uL (ref 4.22–5.81)
RDW: 11.9 % (ref 11.5–15.5)
WBC: 7.1 10*3/uL (ref 4.0–10.5)
nRBC: 0 % (ref 0.0–0.2)

## 2022-10-09 LAB — COMPREHENSIVE METABOLIC PANEL
ALT: 17 U/L (ref 0–44)
AST: 16 U/L (ref 15–41)
Albumin: 4.4 g/dL (ref 3.5–5.0)
Alkaline Phosphatase: 56 U/L (ref 38–126)
Anion gap: 10 (ref 5–15)
BUN: 30 mg/dL — ABNORMAL HIGH (ref 8–23)
CO2: 25 mmol/L (ref 22–32)
Calcium: 8.9 mg/dL (ref 8.9–10.3)
Chloride: 105 mmol/L (ref 98–111)
Creatinine, Ser: 1.09 mg/dL (ref 0.61–1.24)
GFR, Estimated: >60 mL/min (ref >60)
Glucose, Bld: 171 mg/dL — ABNORMAL HIGH (ref 70–99)
Potassium: 3.8 mmol/L (ref 3.5–5.1)
Sodium: 140 mmol/L (ref 135–145)
Total Bilirubin: 1.9 mg/dL — ABNORMAL HIGH (ref 0.3–1.2)
Total Protein: 6.7 g/dL (ref 6.5–8.1)

## 2022-10-09 LAB — LIPASE, BLOOD: Lipase: 27 U/L (ref 11–51)

## 2022-10-09 MED ORDER — SODIUM CHLORIDE 0.9 % IV BOLUS: 1000.0000 mL | Freq: Once | INTRAVENOUS | Status: DC

## 2022-10-09 MED ORDER — FENTANYL CITRATE PF 50 MCG/ML IJ SOSY
50.0000 ug | PREFILLED_SYRINGE | Freq: Once | INTRAMUSCULAR | Status: AC
  Administered 2022-10-09: 50 ug via INTRAVENOUS
  Filled 2022-10-09: qty 1

## 2022-10-09 MED ORDER — TAMSULOSIN HCL 0.4 MG PO CAPS: 0.4000 mg | ORAL_CAPSULE | Freq: Every day | ORAL | 0 refills | Status: AC

## 2022-10-09 MED ORDER — IOHEXOL 350 MG/ML SOLN
100.0000 mL | Freq: Once | INTRAVENOUS | Status: AC | PRN
  Administered 2022-10-09: 75 mL via INTRAVENOUS

## 2022-10-09 MED ORDER — ONDANSETRON HCL 4 MG/2ML IJ SOLN
4.0000 mg | Freq: Once | INTRAMUSCULAR | Status: AC
  Administered 2022-10-09: 4 mg via INTRAVENOUS
  Filled 2022-10-09: qty 2

## 2022-10-09 NOTE — ED Notes (Signed)
Pt aware of need for urine specimen, urinal at bedside, call light within reach.

## 2022-10-09 NOTE — Discharge Instructions (Addendum)
CT imaging revealed a dense of a 2 mm kidney stone which should pass on its own.  Take Flomax, continue to orally rehydrate, try naproxen for pain control and follow-up with urology.

## 2022-10-09 NOTE — ED Notes (Signed)
Pt unable to provide urine at this time

## 2022-10-09 NOTE — ED Provider Notes (Signed)
[facility] [facility]ERGENC[facility] DEPARTMENT AT [facility] Provider Note   CSN: [id number] Arrival date & time: 10/09/22  2130     History  Chief Complaint  Patient presents with   Abdominal Pain    Brett Cervantes is a 62 y.o. male.   Abdominal Pain Associated symptoms: nausea      62 year old male with medical history significant for thoracic aortic aneurysm status post stenting/repair, HTN who presents to the emergency department with abdominal pain.  The patient states he developed right lower quadrant and right flank pain started yesterday afternoon.  It intensified intermittently overnight with occasional sharp pain.  It is now a dull ache.  It has returned this morning and therefore he presented to the emergency department.  He endorses urinary frequency with associated nausea.  No episodes of vomiting.  His last bowel movement was yesterday.  He states that he is not passing gas today.  He denies any dysuria.  He denies any chest pain or shortness of breath.  Home Medications Prior to Admission medications   Medication Sig Start Date End Date Taking? Authorizing Provider  tamsulosin (FLOMAX) 0.4 MG CAPS capsule Take 1 capsule (0.4 mg total) by mouth daily for 5 days. 10/09/22 10/14/22 Yes Ernie Avena, MD  amLODipine (NORVASC) 5 MG tablet Take 5 mg by mouth daily. 03/21/20   [provider]  aspirin EC 81 MG EC tablet Take 1 tablet (81 mg total) by mouth daily at 6 (six) AM. Swallow whole. 04/26/20   Rhyne, Ames Coupe, PA-C  Cyanocobalamin (B-12 PO) Take by mouth.    [provider]  metoprolol succinate (TOPROL-XL) 25 MG 24 hr tablet TAKE ONE TABLET BY MOUTH DAILY 08/13/21   O'Neal, Ronnald Ramp, MD  rosuvastatin (CRESTOR) 20 MG tablet Take by mouth. 08/27/21   [provider]      Allergies    Atorvastatin calcium and Ibuprofen    Review of Systems   Review of Systems  Gastrointestinal:  Positive for abdominal pain and nausea.  All other systems  reviewed and are negative.   Physical Exam Updated Vital Signs BP 113/65 (BP Location: Left Arm)   Pulse 67   Temp 98 F (36.7 C) (Oral)   Resp 16   Wt 86.2 kg   SpO2 100%   BMI 26.50 kg/m  Physical Exam Vitals and nursing note reviewed.  Constitutional:      General: He is not in acute distress.    Appearance: He is well-developed.  HENT:     Head: Normocephalic and atraumatic.  Eyes:     Conjunctiva/sclera: Conjunctivae normal.  Cardiovascular:     Rate and Rhythm: Normal rate and regular rhythm.     Heart sounds: No murmur heard. Pulmonary:     Effort: Pulmonary effort is normal. No respiratory distress.     Breath sounds: Normal breath sounds.  Abdominal:     Palpations: Abdomen is soft.     Tenderness: There is no abdominal tenderness. There is no right CVA tenderness, left CVA tenderness, guarding or rebound.  Musculoskeletal:        General: No swelling.     Cervical back: Neck supple.  Skin:    General: Skin is warm and dry.     Capillary Refill: Capillary refill takes less than 2 seconds.  Neurological:     Mental Status: He is alert.  Psychiatric:        Mood and Affect: Mood normal.     ED Results / Procedures /  Treatments   Labs (all labs ordered are listed, but only abnormal results are displayed) Labs Reviewed  COMPREHENSIVE METABOLIC PANEL - Abnormal; Notable for the following components:      Result Value   Glucose, Bld 171 (*)    BUN 30 (*)    Total Bilirubin 1.9 (*)    All other components within normal limits  CBC - Abnormal; Notable for the following components:   Platelets 144 (*)    All other components within normal limits  URINALYSIS, ROUTINE W REFLEX MICROSCOPIC - Abnormal; Notable for the following components:   Specific Gravity, Urine >1.046 (*)    Hgb urine dipstick LARGE (*)    Protein, ur 30 (*)    All other components within normal limits  LIPASE, BLOOD    EKG None  Radiology CT Angio Abd/Pel W and/or Wo  Contrast  Result Date: 10/09/2022 CLINICAL DATA:  Abdominal aortic aneurysm follow-up EXAM: CTA ABDOMEN AND PELVIS WITHOUT AND WITH CONTRAST TECHNIQUE: Multidetector CT imaging of the abdomen and pelvis was performed using the standard protocol during bolus administration of intravenous contrast. Multiplanar reconstructed images and MIPs were obtained and reviewed to evaluate the vascular anatomy. RADIATION DOSE REDUCTION: This exam was performed according to the departmental dose-optimization program which includes automated exposure control, adjustment of the mA and/or kV according to patient size and/or use of iterative reconstruction technique. CONTRAST:  75 mL OMNIPAQUE IOHEXOL 350 MG/ML SOLN COMPARISON:  10/12/2020 FINDINGS: VASCULAR Aorta: Distal tip of thoracic aortic stent appears appropriately positioned. No abnormality of the abdominal aorta. Celiac: Patent without evidence of aneurysm, dissection, vasculitis or significant stenosis. SMA: Patent without evidence of aneurysm, dissection, vasculitis or significant stenosis. Renals: Both renal arteries are patent without evidence of aneurysm, dissection, vasculitis, fibromuscular dysplasia or significant stenosis. IMA: Patent without evidence of aneurysm, dissection, vasculitis or significant stenosis. Inflow: Patent without evidence of aneurysm, dissection, vasculitis or significant stenosis. Proximal Outflow: Bilateral common femoral and visualized portions of the superficial and profunda femoral arteries are patent without evidence of aneurysm, dissection, vasculitis or significant stenosis. Veins: No obvious venous abnormality within the limitations of this arterial phase study. Review of the MIP images confirms the above findings. NON-VASCULAR Lower chest: No acute abnormality. Hepatobiliary: Diffuse hepatic low-density suspicious for steatosis. 1.2 cm simple cyst in the medial right hepatic lobe (image 12, series 11) it does not require dedicated  imaging follow-up. No significant abnormality of the gallbladder or bile ducts. Pancreas: Unremarkable. No pancreatic ductal dilatation or surrounding inflammatory changes. Spleen: Normal in size without focal abnormality. Adrenals/Urinary Tract: Adrenal glands are normal. 1.5 cm simple cyst in the medial right kidney does not require dedicated imaging follow-up. 2 mm obstructing calculus present in the right distal ureter causing minimal right hydronephrosis or hydroureter. (Image 164, series 4). No significant abnormality of the left kidney. Bladder is unremarkable. Stomach/Bowel: Sigmoid colon diverticulosis without evidence of acute diverticulitis. No bowel dilatation to indicate ileus or obstruction. Appendix is normal. Lymphatic: No enlarged abdominal or pelvic lymph nodes. Reproductive: Prostate is mildly enlarged. Other: No abdominal wall hernia or abnormality. No abdominopelvic ascites. Musculoskeletal: Moderate degenerative changes of the right hip joint. IMPRESSION: 1. No significant abnormality of the abdominal aorta or its branches. 2. 2 mm right distal ureteral calculus causing minimal right hydronephrosis or hydroureter. 3. Sigmoid colon diverticulosis. 4. Hepatic steatosis. 5. Mildly enlarged prostate. These results will be called to the ordering clinician or representative by the Radiologist Assistant, and communication documented in the PACS or [employer].  Electronically Signed   By: Acquanetta Belling M.D.   On: 10/09/2022 09:21    Procedures Procedures    Medications Ordered in ED Medications  fentaNYL (SUBLIMAZE) injection 50 mcg (50 mcg Intravenous Given 10/09/22 0718)  ondansetron (ZOFRAN) injection 4 mg (4 mg Intravenous Given 10/09/22 0718)  iohexol (OMNIPAQUE) 350 MG/ML injection 100 mL (75 mLs Intravenous Contrast Given 10/09/22 0745)  fentaNYL (SUBLIMAZE) injection 50 mcg (50 mcg Intravenous Given 10/09/22 0810)    ED Course/ Medical Decision Making/ A&P                                  Medical Decision Making Amount and/or Complexity of Data Reviewed Labs: ordered. Radiology: ordered.  Risk Prescription drug management.    62 year old male with medical history significant for thoracic aortic aneurysm status post stenting/repair, HTN who presents to the emergency department with abdominal pain.  The patient states he developed right lower quadrant and right flank pain started yesterday afternoon.  It intensified intermittently overnight with occasional sharp pain.  It is now a dull ache.  It has returned this morning and therefore he presented to the emergency department.  He endorses urinary frequency with associated nausea.  No episodes of vomiting.  His last bowel movement was yesterday.  He states that he is not passing gas today.  He denies any dysuria.  He denies any chest pain or shortness of breath.  On arrival, the patient was vitally stable, afebrile, not tachycardic or tachypneic, saturating well on room air.  Sinus rhythm noted on cardiac telemetry.  Physical exam generally unremarkable with no significant CVA tenderness, no abdominal tenderness on exam.  Given the patient's waxing and waning symptoms, considered nephrolithiasis that has passed however given the patient's history of thoracic AAA repair, also given recent HPI of not passing gas, considered bowel obstruction, considered abdominal AAA.  IV access was obtained, the patient was administered IV fentanyl and Zofran and labs were obtained.  Will obtain CTA of the abdomen pelvis.  Labs: Lipase normal, CMP with mild hyperglycemia to 171, otherwise unremarkable, CBC without a leukocytosis or anemia, urinalysis not evidence of UTI.  CTA abdomen pelvis:  IMPRESSION:  1. No significant abnormality of the abdominal aorta or its  branches.  2. 2 mm right distal ureteral calculus causing minimal right  hydronephrosis or hydroureter.  3. Sigmoid colon diverticulosis.  4. Hepatic steatosis.  5. Mildly  enlarged prostate.   Feeling better after Fentanyl and Zofran. Pt informed of the diagnosis. Advised follow-up with Urology. Stable for DC.   Final Clinical Impression(s) / ED Diagnoses Final diagnoses:  Kidney stone    Rx / DC Orders ED Discharge Orders          Ordered    tamsulosin (FLOMAX) 0.4 MG CAPS capsule  Daily        10/09/22 0950              Ernie Avena, MD 10/09/22 [phone]

## 2022-10-09 NOTE — ED Triage Notes (Signed)
Presents for RLQ and R flank pain that started yesterday afternoon and has intensified intermittently.  Urinating this morning brought pain back. Endorses urinary frequency, subj fever, nausea Denies emesis  H/o AAA with repair 2 years ago

## 2022-10-30 DIAGNOSIS — E785 Hyperlipidemia, unspecified: Secondary | ICD-10-CM | POA: Diagnosis not present

## 2022-10-30 DIAGNOSIS — I10 Essential (primary) hypertension: Secondary | ICD-10-CM | POA: Diagnosis not present

## 2022-10-30 DIAGNOSIS — H6123 Impacted cerumen, bilateral: Secondary | ICD-10-CM | POA: Diagnosis not present

## 2022-10-30 DIAGNOSIS — Z125 Encounter for screening for malignant neoplasm of prostate: Secondary | ICD-10-CM | POA: Diagnosis not present

## 2022-10-30 DIAGNOSIS — Z23 Encounter for immunization: Secondary | ICD-10-CM | POA: Diagnosis not present

## 2022-10-30 DIAGNOSIS — Z0000 Encounter for general adult medical examination without abnormal findings: Secondary | ICD-10-CM | POA: Diagnosis not present

## 2022-10-30 DIAGNOSIS — E538 Deficiency of other specified B group vitamins: Secondary | ICD-10-CM | POA: Diagnosis not present

## 2022-11-11 DIAGNOSIS — L570 Actinic keratosis: Secondary | ICD-10-CM | POA: Diagnosis not present

## 2022-11-11 DIAGNOSIS — L821 Other seborrheic keratosis: Secondary | ICD-10-CM | POA: Diagnosis not present

## 2022-11-11 DIAGNOSIS — L814 Other melanin hyperpigmentation: Secondary | ICD-10-CM | POA: Diagnosis not present

## 2022-11-11 DIAGNOSIS — L820 Inflamed seborrheic keratosis: Secondary | ICD-10-CM | POA: Diagnosis not present

## 2022-11-11 DIAGNOSIS — L578 Other skin changes due to chronic exposure to nonionizing radiation: Secondary | ICD-10-CM | POA: Diagnosis not present

## 2022-12-02 NOTE — Progress Notes (Deleted)
[facility] [place] PROGRESS NOTE  ASSESSMENT / PLAN: Brett Cervantes is a 62 y.o. male status post Zone 2 TEVAR, left carotid-subclavian bypass 04/24/20 for 79mm descending thoracic aortic aneurysm. Follow up CTA at an outside hospital.  I reviewed this personally.  Aneurysm sac now measures 71 mm in greatest dimension. Plan on surveillance with me annually with CTA.  SUBJECTIVE: No complaints. Looks and feels great.  OBJECTIVE: There were no vitals taken for this visit.  Left collar incision well healed 2+ L radial pulse 2+ PT pulses bilaterally     Latest Ref Rng & Units 10/09/2022    6:27 AM 04/25/2020    5:17 AM 04/25/2020   12:43 AM  CBC  WBC 4.0 - 10.5 K/uL 7.1  13.6  11.3   Hemoglobin 13.0 - 17.0 g/dL 09.8  11.9  14.7   Hematocrit 39.0 - 52.0 % 42.2  36.2  37.5   Platelets 150 - 400 K/uL 144  110  105         Latest Ref Rng & Units 10/09/2022    6:27 AM 04/25/2020    5:17 AM 04/25/2020   12:43 AM  CMP  Glucose 70 - 99 mg/dL 829  562  130   BUN 8 - 23 mg/dL 30  21  22    Creatinine 0.61 - 1.24 mg/dL 8.65  7.84  6.96   Sodium 135 - 145 mmol/L 140  137  138   Potassium 3.5 - 5.1 mmol/L 3.8  4.0  4.1   Chloride 98 - 111 mmol/L 105  105  104   CO2 22 - 32 mmol/L 25  22  24    Calcium 8.9 - 10.3 mg/dL 8.9  8.3  8.5   Total Protein 6.5 - 8.1 g/dL 6.7     Total Bilirubin 0.3 - 1.2 mg/dL 1.9     Alkaline Phos 38 - 126 U/L 56     AST 15 - 41 U/L 16     ALT 0 - 44 U/L 17      CTA reviewed. No endoleak. Bypass patent. Remainder of aorta healthy.   Brett Cervantes. Brett Antu, MD [facility] Phone Number: [phone] 12/02/2022 7:50 PM

## 2022-12-03 ENCOUNTER — Ambulatory Visit: Payer: BC Managed Care – PPO | Admitting: Vascular Surgery

## 2022-12-04 ENCOUNTER — Other Ambulatory Visit: Payer: Self-pay

## 2022-12-04 DIAGNOSIS — Z95828 Presence of other vascular implants and grafts: Secondary | ICD-10-CM

## 2022-12-04 DIAGNOSIS — Z8679 Personal history of other diseases of the circulatory system: Secondary | ICD-10-CM

## 2022-12-05 NOTE — Addendum Note (Signed)
Addended by: Leilani Able, Geneieve Duell A on: 12/05/2022 12:02 PM   Modules accepted: Orders

## 2022-12-16 DIAGNOSIS — Z8679 Personal history of other diseases of the circulatory system: Secondary | ICD-10-CM | POA: Diagnosis not present

## 2022-12-16 DIAGNOSIS — Z9889 Other specified postprocedural states: Secondary | ICD-10-CM | POA: Diagnosis not present

## 2022-12-23 NOTE — Progress Notes (Unsigned)
[facility] [place] PROGRESS NOTE  ASSESSMENT / PLAN: Brett Cervantes is a 62 y.o. male status post Zone 2 TEVAR, left carotid-subclavian bypass 04/24/20 for 79mm descending thoracic aortic aneurysm. Follow up CTA at an outside hospital.  I reviewed this personally.  Aneurysm sac now measures 71 mm in greatest dimension. Plan on surveillance with me annually with CTA.  SUBJECTIVE: No complaints. Looks and feels great.  OBJECTIVE: There were no vitals taken for this visit.  Left collar incision well healed 2+ L radial pulse 2+ PT pulses bilaterally     Latest Ref Rng & Units 10/09/2022    6:27 AM 04/25/2020    5:17 AM 04/25/2020   12:43 AM  CBC  WBC 4.0 - 10.5 K/uL 7.1  13.6  11.3   Hemoglobin 13.0 - 17.0 g/dL 16.1  09.6  04.5   Hematocrit 39.0 - 52.0 % 42.2  36.2  37.5   Platelets 150 - 400 K/uL 144  110  105         Latest Ref Rng & Units 10/09/2022    6:27 AM 04/25/2020    5:17 AM 04/25/2020   12:43 AM  CMP  Glucose 70 - 99 mg/dL 409  811  914   BUN 8 - 23 mg/dL 30  21  22    Creatinine 0.61 - 1.24 mg/dL 7.82  9.56  2.13   Sodium 135 - 145 mmol/L 140  137  138   Potassium 3.5 - 5.1 mmol/L 3.8  4.0  4.1   Chloride 98 - 111 mmol/L 105  105  104   CO2 22 - 32 mmol/L 25  22  24    Calcium 8.9 - 10.3 mg/dL 8.9  8.3  8.5   Total Protein 6.5 - 8.1 g/dL 6.7     Total Bilirubin 0.3 - 1.2 mg/dL 1.9     Alkaline Phos 38 - 126 U/L 56     AST 15 - 41 U/L 16     ALT 0 - 44 U/L 17      INDICATION: Status post thoracic aortic aneurysm repair.   COMPARISON:  CT angiography chest abdomen pelvis 08/06/2021.   TECHNIQUE:  CT ANGIO CHEST was performed during IV administration of contrast. Contrast: 80 mL IOPAMIDOL 76 % IV SOLN   Image post processing was performed creating multi planar 2D and angiographic 3D MIP, shaded surface rendering or 3D volume rendering images for review and comparison. Radiation dose reduction was utilized (automated exposure control, mA or kV  adjustment based on patient size, or iterative image reconstruction).   Contrast bolus quality:  satisfactory   FINDINGS:   Vascular:  Heart size appears stable. No large pericardial effusion. Mild to moderate coronary calcific lesion seen. Ascending aorta is stable without evidence of overt aneurysm. Redemonstration of coils embolization of the proximal left subclavian artery. Left common carotid and right brachiocephalic arteries appear patent. There is a patent left carotid to left subclavian artery bypass. Thoracic aortic stent seen extending from the distal aortic arch to the level of the mid/distal descending thoracic aorta; similar prior imaging. The excluded sac measures approximately 7.1 cm in greatest dimension; similar prior imaging. No definite endoleak is clearly identified. Stent graft appears grossly patent without kinking or disruption. Visualized distal descending thoracic aorta and visualized upper abdominal aorta appear normal in caliber. No definite aortic dissection seen. Celiac, SMA, and bilateral renal artery origins appear patent. There is overall conventional appearing venous change.   Nonvascular:  Atelectasis/scarring  in the lungs. No large effusion, pneumothorax, overt focal consolidation/infiltrate, or definite large lung mass clearly appreciated. Central airways appear grossly patent. Stable 4 mm subpleural nodule in the right middle lobe series 11 image 84. Upper neck/thoracic inlet is without definite acute abnormality. No definite overt suspicious adenopathy seen in the chest. Scattered shotty lymph nodes are present. Small hiatal hernia. Simple benign-appearing hepatic and renal cysts are seen. Some smaller subcentimeter renal hypodensities are too small to characterize. Scattered degenerative osseous changes. No definite aggressive osseous lesion seen.    IMPRESSION:  1. Patent TEVAR graft. No evidence of endoleak. Excluded sac measures approximately 7.1 cm; stable.   2.  Stable coil embolization of the left subclavian artery with patent left common carotid to left subclavian artery bypass graft.  3.  Remaining incidental/stable findings as above.   [name]. Brett Antu, MD [facility] Phone Number: [phone] 12/23/2022 8:26 AM

## 2022-12-24 ENCOUNTER — Ambulatory Visit (INDEPENDENT_AMBULATORY_CARE_PROVIDER_SITE_OTHER): Payer: BC Managed Care – PPO | Admitting: Vascular Surgery

## 2022-12-24 ENCOUNTER — Encounter: Payer: Self-pay | Admitting: Vascular Surgery

## 2022-12-24 VITALS — BP 123/72 | HR 56 | Temp 98.1°F | Ht 71.0 in | Wt 187.3 lb

## 2022-12-24 DIAGNOSIS — Z95828 Presence of other vascular implants and grafts: Secondary | ICD-10-CM | POA: Diagnosis not present

## 2022-12-27 DIAGNOSIS — L570 Actinic keratosis: Secondary | ICD-10-CM | POA: Diagnosis not present

## 2022-12-27 DIAGNOSIS — D485 Neoplasm of uncertain behavior of skin: Secondary | ICD-10-CM | POA: Diagnosis not present

## 2023-04-30 DIAGNOSIS — I7123 Aneurysm of the descending thoracic aorta, without rupture: Secondary | ICD-10-CM | POA: Diagnosis not present

## 2023-04-30 DIAGNOSIS — E785 Hyperlipidemia, unspecified: Secondary | ICD-10-CM | POA: Diagnosis not present

## 2023-04-30 DIAGNOSIS — Z23 Encounter for immunization: Secondary | ICD-10-CM | POA: Diagnosis not present

## 2023-04-30 DIAGNOSIS — I10 Essential (primary) hypertension: Secondary | ICD-10-CM | POA: Diagnosis not present

## 2023-04-30 DIAGNOSIS — I2510 Atherosclerotic heart disease of native coronary artery without angina pectoris: Secondary | ICD-10-CM | POA: Diagnosis not present

## 2023-05-20 NOTE — Progress Notes (Unsigned)
 Cardiology Office Note:  .   Date:  05/21/2023  ID:  Brett Cervantes, DOB 05-18-1960, MRN [id number] PCP: Gweneth Dimitri, MD  [facility] Providers Cardiologist:  Reatha Harps, MD { History of Present Illness: .    Chief Complaint  Patient presents with   Chest Pain    Brett Cervantes is a 63 y.o. male with history of descending thoracic aortic aneurysm who presents for follow-up.    History of Present Illness   Brett Cervantes is a 63 year old male with a history of descending thoracic aortic aneurysm repair who presents for follow-up.  He has been experiencing dull chest pain similar to when he had his descending thoracic aortic aneurysm. The symptoms have been present for about two and a half weeks and occur infrequently, often triggered by physical activity such as working overhead or getting up and down frequently. He describes the sensation as 'something right in here' and notes that it is less severe than the pain experienced during his aneurysm. The pain is not sharp and does not feel like pressure.  He recalls a similar episode in the past, which was work-related, and notes that his current symptoms could be related to recent physical activities, such as helping build a shed and splitting wood. He has attempted to manage the discomfort with Tylenol, taking it intermittently over a few days, which he felt was helping, but he discontinued use due to a dislike of taking medication. The symptoms have improved since he stopped the overhead work, although he still experiences discomfort with certain movements, such as moving his arm or looking back.  A previous CT scan in August showed stable repair of his aneurysm. No sharp pain, pressure, or acute changes. He reports a past episode of fever about a month ago, which lasted two days with a temperature slightly above 100F, but he has no current fever, chills, cough, or congestion.          Problem List 1. Descending  Thoracic Aortic Aneurysm (7.8 cm) -S/p TEVAR with coverage of left subclavian  -Left common carotid artery/Left subclavian bypass 2. HTN 3. HLD -T chol 111, HDL 42, LDL 51, TG 89 4. CAD -Calcium score 182 (79th percentile) -25-49% pLAD    ROS: All other ROS reviewed and negative. Pertinent positives noted in the HPI.     Studies Reviewed: Marland Kitchen   EKG Interpretation Date/Time:  Wednesday May 21 2023 13:34:30 EDT Ventricular Rate:  83 PR Interval:  166 QRS Duration:  94 QT Interval:  366 QTC Calculation: 430 R Axis:   -53  Text Interpretation: Normal sinus rhythm Left anterior fascicular block Confirmed by Lennie Odor [phone]) on 05/21/2023 1:44:31 PM    CTA 12/16/2022 1.  Patent TEVAR graft. No evidence of endoleak. Excluded sac measures approximately 7.1 cm; stable.  2.  Stable coil embolization of the left subclavian artery with patent left common carotid to left subclavian artery bypass graft.  3.  Remaining incidental/stable findings as above.   CTA 10/13/2020 IMPRESSION: 1. Stable patency and positioning of thoracic endograft with further reduction in thrombosed aneurysm sac size at the level of the distal arch and descending thoracic aorta. Transverse dimensions were obtained at the level of the carina on axial images for consistent measuring purposes. No evidence of endoleak or complication. Left common carotid to subclavian artery bypass graft is normally patent. 2. No findings on the CTA of the abdomen and pelvis. Imaging of the abdomen and pelvis is  not necessary for future follow-up of the thoracic endograft. Physical Exam:   VS:  BP 120/70   Pulse 83   Ht 5\' 11"  (1.803 m)   Wt 184 lb 3.2 oz (83.6 kg)   SpO2 99%   BMI 25.69 kg/m    Wt Readings from Last 3 Encounters:  05/21/23 184 lb 3.2 oz (83.6 kg)  12/24/22 187 lb 4.8 oz (85 kg)  10/09/22 190 lb (86.2 kg)    GEN: Well nourished, well developed in no acute distress NECK: No JVD; No carotid  bruits CARDIAC: RRR, no murmurs, rubs, gallops RESPIRATORY:  Clear to auscultation without rales, wheezing or rhonchi  ABDOMEN: Soft, non-tender, non-distended EXTREMITIES:  No edema; No deformity  ASSESSMENT AND PLAN: .   Assessment and Plan    Descending Thoracic Aortic Aneurysm Repair Recent symptoms raised concerns about potential changes. Explained that repair should prevent further growth or leakage. Further imaging necessary to ensure stability. - Order CTA of the chest to assess stability of aneurysm repair. - Schedule echocardiogram to evaluate cardiac function. - Continue aspirin therapy. - Schedule follow-up appointment in one year.  Chest Pain Intermittent dull chest pain associated with activity. Differential includes aneurysm repair issues or musculoskeletal strain. EKG shows no acute ischemic changes. Symptoms improving, further evaluation necessary. - Order CTA of the chest to assess stability of aneurysm repair. - Schedule echocardiogram to evaluate cardiac function. - Continue aspirin therapy. - Schedule follow-up appointment in one year.  Hypertension Blood pressure well-controlled at 120/70 mmHg. Current medication regimen effective. - Continue amlodipine 5 mg daily. - Continue metoprolol succinate 25 mg daily.  Hyperlipidemia LDL cholesterol well-controlled at 51 mg/dL. Current medication regimen effective. - Continue Crestor 20 mg daily.              Follow-up: Return in about 1 year (around 05/20/2024).  Signed, Lenna Gilford. Flora Lipps, MD, [facility]  [facility]  [address], [address] [place], [place] [address] [phone]  2:03 PM

## 2023-05-21 ENCOUNTER — Encounter: Payer: Self-pay | Admitting: Cardiovascular Disease

## 2023-05-21 ENCOUNTER — Ambulatory Visit: Attending: Cardiovascular Disease | Admitting: Cardiovascular Disease

## 2023-05-21 VITALS — BP 120/70 | HR 83 | Ht 71.0 in | Wt 184.2 lb

## 2023-05-21 DIAGNOSIS — E782 Mixed hyperlipidemia: Secondary | ICD-10-CM

## 2023-05-21 DIAGNOSIS — I7123 Aneurysm of the descending thoracic aorta, without rupture: Secondary | ICD-10-CM

## 2023-05-21 DIAGNOSIS — R079 Chest pain, unspecified: Secondary | ICD-10-CM | POA: Diagnosis not present

## 2023-05-21 DIAGNOSIS — R072 Precordial pain: Secondary | ICD-10-CM | POA: Diagnosis not present

## 2023-05-21 DIAGNOSIS — I2510 Atherosclerotic heart disease of native coronary artery without angina pectoris: Secondary | ICD-10-CM

## 2023-05-21 NOTE — Patient Instructions (Addendum)
 Medication Instructions:  Your physician recommends that you continue on your current medications as directed. Please refer to the Current Medication list given to you today.    *If you need a refill on your cardiac medications before your next appointment, please call your pharmacy*   Lab Work: BMET   If you have labs (blood work) drawn today and your tests are completely normal, you will receive your results only by: MyChart Message (if you have MyChart) OR A paper copy in the mail If you have any lab test that is abnormal or we need to change your treatment, we will call you to review the results.   Testing/Procedures:  Echo will be scheduled at [address].  Your physician has requested that you have an echocardiogram. Echocardiography is a painless test that uses sound waves to create images of your heart. It provides your doctor with information about the size and shape of your heart and how well your heart's chambers and valves are working. This procedure takes approximately one hour. There are no restrictions for this procedure. Please do NOT wear cologne, perfume, aftershave, or lotions (deodorant is allowed). Please arrive 15 minutes prior to your appointment time.  Non-Cardiac CT Angiography (CTA), is a special type of CT scan that uses a computer to produce multi-dimensional views of major blood vessels throughout the body. In CT angiography, a contrast material is injected through an IV to help visualize the blood vessels     Follow-Up: At [facility], you and your health needs are our priority.  As part of our continuing mission to provide you with exceptional heart care, we have created designated Provider Care Teams.  These Care Teams include your primary Cardiologist (physician) and Advanced Practice Providers (APPs -  Physician Assistants and Nurse Practitioners) who all work together to provide you with the care you need, when you need it.  We  recommend signing up for the patient portal called "MyChart".  Sign up information is provided on this After Visit Summary.  MyChart is used to connect with patients for Virtual Visits (Telemedicine).  Patients are able to view lab/test results, encounter notes, upcoming appointments, etc.  Non-urgent messages can be sent to your provider as well.   To learn more about what you can do with MyChart, go to [email or web address].    Your next appointment:   1 year(s)  The format for your next appointment:   In Person  Provider:   Reatha Harps, MD    Other Instructions

## 2023-05-22 ENCOUNTER — Encounter: Payer: Self-pay | Admitting: Cardiovascular Disease

## 2023-05-22 ENCOUNTER — Other Ambulatory Visit: Payer: Self-pay | Admitting: Cardiovascular Disease

## 2023-05-22 DIAGNOSIS — N179 Acute kidney failure, unspecified: Secondary | ICD-10-CM

## 2023-05-22 LAB — BASIC METABOLIC PANEL
BUN/Creatinine Ratio: 23 (ref 10–24)
BUN: 34 mg/dL — ABNORMAL HIGH (ref 8–27)
CO2: 23 mmol/L (ref 20–29)
Calcium: 9.2 mg/dL (ref 8.6–10.2)
Chloride: 105 mmol/L (ref 96–106)
Creatinine, Ser: 1.45 mg/dL — ABNORMAL HIGH (ref 0.76–1.27)
Glucose: 89 mg/dL (ref 70–99)
Potassium: 5.2 mmol/L (ref 3.5–5.2)
Sodium: 143 mmol/L (ref 134–144)
eGFR: 54 mL/min/{1.73_m2} — ABNORMAL LOW (ref >59)

## 2023-05-22 NOTE — Progress Notes (Signed)
 BMP ordered for Monday.  Gerri Spore T. Flora Lipps, MD, [facility]  [facility]  [address] [place], [place] [address] [phone]  8:48 AM

## 2023-05-24 ENCOUNTER — Ambulatory Visit (HOSPITAL_BASED_OUTPATIENT_CLINIC_OR_DEPARTMENT_OTHER)
Admission: RE | Admit: 2023-05-24 | Discharge: 2023-05-24 | Disposition: A | Discharge status: Home / Self Care | Source: Ambulatory Visit | Attending: Cardiovascular Disease | Admitting: Cardiovascular Disease

## 2023-05-24 DIAGNOSIS — I517 Cardiomegaly: Secondary | ICD-10-CM | POA: Diagnosis not present

## 2023-05-24 DIAGNOSIS — R079 Chest pain, unspecified: Secondary | ICD-10-CM | POA: Insufficient documentation

## 2023-05-24 DIAGNOSIS — I7123 Aneurysm of the descending thoracic aorta, without rupture: Secondary | ICD-10-CM | POA: Diagnosis not present

## 2023-05-24 MED ORDER — IOHEXOL 350 MG/ML SOLN
100.0000 mL | Freq: Once | INTRAVENOUS | Status: AC | PRN
  Administered 2023-05-24: 100 mL via INTRAVENOUS

## 2023-05-26 ENCOUNTER — Telehealth: Payer: Self-pay | Admitting: Cardiovascular Disease

## 2023-05-26 DIAGNOSIS — N179 Acute kidney failure, unspecified: Secondary | ICD-10-CM

## 2023-05-26 NOTE — Telephone Encounter (Signed)
 Mr. Brett Cervantes:   Your lab work shows that your kidney function is a little elevated.  I would like for you to drink plenty of water over the next few days and then come back to the office on Monday to recheck this.  You can just come to the office without an appointment.  We need to make sure that this number is isolated and your kidney function improves.  I will be in touch after the repeat lab test.   Gerri Spore T. Flora Lipps, MD, [facility]  [facility]  [address], [address] [place], [place] [address] [phone]  8:48 AM   Patient identification verified by 2 forms. Marilynn Rail, RN    Called and spoke to patient  Relayed provider message  Advised patient to present to lab on Thursday  Patient verbalized understanding, no questions at this time

## 2023-05-26 NOTE — Telephone Encounter (Signed)
 Pt calling in regards to results. Please advise

## 2023-05-28 NOTE — Telephone Encounter (Signed)
 Patient identification verified by 2 forms. Brett Rail, RN    Called and spoke to patient  Informed patient no change to plan, present to lab tomorrow  Patient agrees, no questions at this time

## 2023-05-28 NOTE — Telephone Encounter (Signed)
 Patient returned RN's call regarding patient's test results.

## 2023-05-29 DIAGNOSIS — N179 Acute kidney failure, unspecified: Secondary | ICD-10-CM | POA: Diagnosis not present

## 2023-05-29 LAB — BASIC METABOLIC PANEL WITH GFR
BUN/Creatinine Ratio: 16 (ref 10–24)
BUN: 18 mg/dL (ref 8–27)
CO2: 25 mmol/L (ref 20–29)
Calcium: 9.5 mg/dL (ref 8.6–10.2)
Chloride: 104 mmol/L (ref 96–106)
Creatinine, Ser: 1.13 mg/dL (ref 0.76–1.27)
Glucose: 121 mg/dL — ABNORMAL HIGH (ref 70–99)
Potassium: 5.1 mmol/L (ref 3.5–5.2)
Sodium: 142 mmol/L (ref 134–144)
eGFR: 73 mL/min/{1.73_m2} (ref >59)

## 2023-06-04 ENCOUNTER — Ambulatory Visit: Admitting: Cardiology

## 2023-06-06 ENCOUNTER — Encounter: Payer: Self-pay | Admitting: Cardiovascular Disease

## 2023-06-17 ENCOUNTER — Encounter: Payer: Self-pay | Admitting: Cardiovascular Disease

## 2023-06-17 ENCOUNTER — Ambulatory Visit (HOSPITAL_BASED_OUTPATIENT_CLINIC_OR_DEPARTMENT_OTHER)

## 2023-06-17 DIAGNOSIS — R079 Chest pain, unspecified: Secondary | ICD-10-CM | POA: Diagnosis not present

## 2023-06-17 LAB — ECHOCARDIOGRAM COMPLETE
Area-P 1/2: 3.48 cm2
S' Lateral: 2.88 cm

## 2023-09-15 DIAGNOSIS — L570 Actinic keratosis: Secondary | ICD-10-CM | POA: Diagnosis not present

## 2023-09-15 DIAGNOSIS — L821 Other seborrheic keratosis: Secondary | ICD-10-CM | POA: Diagnosis not present

## 2023-10-06 DIAGNOSIS — Z6825 Body mass index (BMI) 25.0-25.9, adult: Secondary | ICD-10-CM | POA: Diagnosis not present

## 2023-10-06 DIAGNOSIS — R509 Fever, unspecified: Secondary | ICD-10-CM | POA: Diagnosis not present

## 2023-10-06 DIAGNOSIS — A770 Spotted fever due to Rickettsia rickettsii: Secondary | ICD-10-CM | POA: Diagnosis not present

## 2023-10-29 DIAGNOSIS — I10 Essential (primary) hypertension: Secondary | ICD-10-CM | POA: Diagnosis not present

## 2023-10-29 DIAGNOSIS — Z125 Encounter for screening for malignant neoplasm of prostate: Secondary | ICD-10-CM | POA: Diagnosis not present

## 2023-10-29 DIAGNOSIS — E785 Hyperlipidemia, unspecified: Secondary | ICD-10-CM | POA: Diagnosis not present

## 2023-10-29 DIAGNOSIS — D696 Thrombocytopenia, unspecified: Secondary | ICD-10-CM | POA: Diagnosis not present

## 2023-10-29 DIAGNOSIS — E538 Deficiency of other specified B group vitamins: Secondary | ICD-10-CM | POA: Diagnosis not present

## 2023-11-04 DIAGNOSIS — Z0000 Encounter for general adult medical examination without abnormal findings: Secondary | ICD-10-CM | POA: Diagnosis not present

## 2023-11-04 DIAGNOSIS — E785 Hyperlipidemia, unspecified: Secondary | ICD-10-CM | POA: Diagnosis not present

## 2023-11-04 DIAGNOSIS — D696 Thrombocytopenia, unspecified: Secondary | ICD-10-CM | POA: Diagnosis not present

## 2023-11-04 DIAGNOSIS — I2510 Atherosclerotic heart disease of native coronary artery without angina pectoris: Secondary | ICD-10-CM | POA: Diagnosis not present

## 2023-11-04 DIAGNOSIS — Z1331 Encounter for screening for depression: Secondary | ICD-10-CM | POA: Diagnosis not present

## 2023-11-04 DIAGNOSIS — I10 Essential (primary) hypertension: Secondary | ICD-10-CM | POA: Diagnosis not present

## 2023-11-25 DIAGNOSIS — L814 Other melanin hyperpigmentation: Secondary | ICD-10-CM | POA: Diagnosis not present

## 2023-11-25 DIAGNOSIS — L578 Other skin changes due to chronic exposure to nonionizing radiation: Secondary | ICD-10-CM | POA: Diagnosis not present

## 2023-11-25 DIAGNOSIS — L821 Other seborrheic keratosis: Secondary | ICD-10-CM | POA: Diagnosis not present

## 2023-11-25 DIAGNOSIS — L570 Actinic keratosis: Secondary | ICD-10-CM | POA: Diagnosis not present

## 2024-01-13 DIAGNOSIS — D696 Thrombocytopenia, unspecified: Secondary | ICD-10-CM | POA: Diagnosis not present
